# Patient Record
Sex: Male | Born: 1950 | Race: Black or African American | Hispanic: No | Marital: Married | Smoking: Former smoker
Health system: Southern US, Community
[De-identification: ages and names within clinical notes are randomized; demographics above are authoritative.]

## PROBLEM LIST (undated history)

## (undated) DIAGNOSIS — H409 Unspecified glaucoma: Secondary | ICD-10-CM

## (undated) DIAGNOSIS — I82409 Acute embolism and thrombosis of unspecified deep veins of unspecified lower extremity: Secondary | ICD-10-CM

## (undated) DIAGNOSIS — M1990 Unspecified osteoarthritis, unspecified site: Secondary | ICD-10-CM

## (undated) DIAGNOSIS — Z9889 Other specified postprocedural states: Secondary | ICD-10-CM

## (undated) DIAGNOSIS — E7800 Pure hypercholesterolemia, unspecified: Secondary | ICD-10-CM

## (undated) DIAGNOSIS — R112 Nausea with vomiting, unspecified: Secondary | ICD-10-CM

## (undated) DIAGNOSIS — I10 Essential (primary) hypertension: Secondary | ICD-10-CM

## (undated) DIAGNOSIS — E119 Type 2 diabetes mellitus without complications: Secondary | ICD-10-CM

---

## 2004-10-11 ENCOUNTER — Ambulatory Visit: Payer: Self-pay | Admitting: Unknown Physician Specialty

## 2007-07-26 ENCOUNTER — Emergency Department: Payer: Self-pay | Admitting: Emergency Medicine

## 2007-07-26 ENCOUNTER — Other Ambulatory Visit: Payer: Self-pay

## 2007-07-28 ENCOUNTER — Ambulatory Visit: Payer: Self-pay | Admitting: Internal Medicine

## 2008-06-11 IMAGING — CT CT HEAD WITHOUT CONTRAST
2 series · 16 of 30 positions shown, 20 images · non-contrast
Comparison: none

REASON FOR EXAM: vertigo
COMMENTS:

PROCEDURE:     CT  - CT HEAD WITHOUT CONTRAST  - July 26, 2007  [DATE]
RESULT:     Comparison: No available comparison exam.
Procedure: CT examination of the head was performed without intravenous
contrast. Collimation is 5 mm.

[Series 2: without · axial · non-contrast · 0.45mm/px · z∈[+232,+352]mm · 13 of 28 slices shown, 17 images]
[im 2/28  brain]
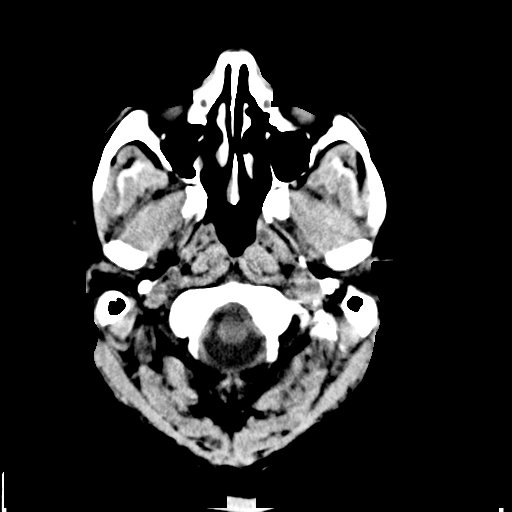
[im 2/28  bone]
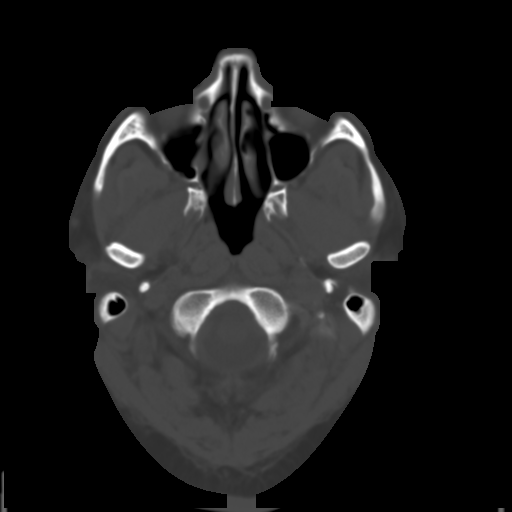
[im 4/28  brain]
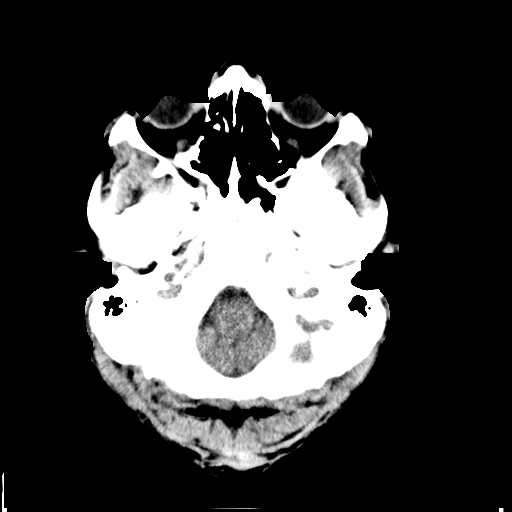
[im 6/28  brain]
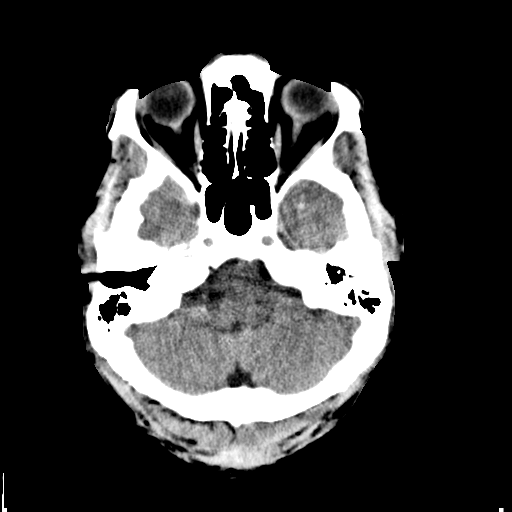
[im 8/28  brain]
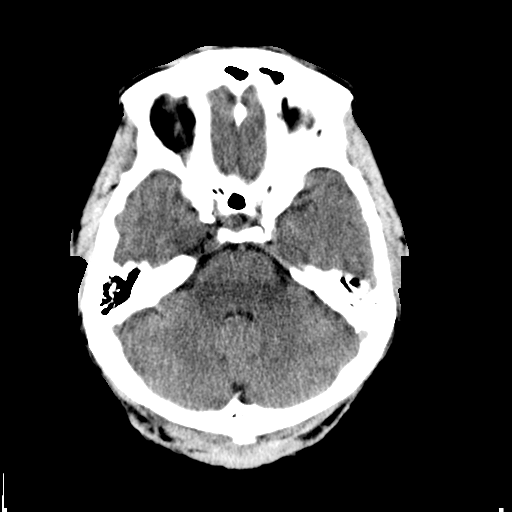
[im 10/28  brain]
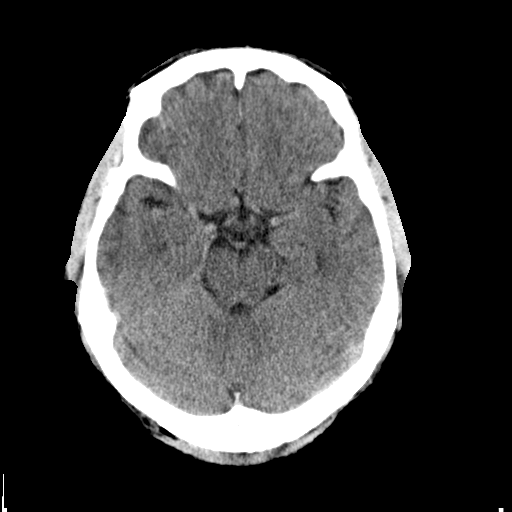
[im 10/28  bone]
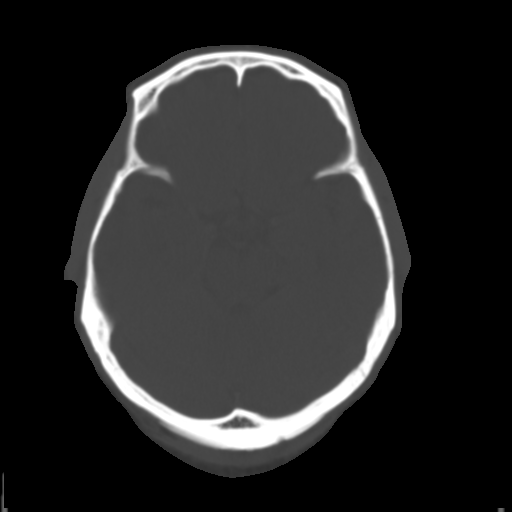
[im 12/28  brain]
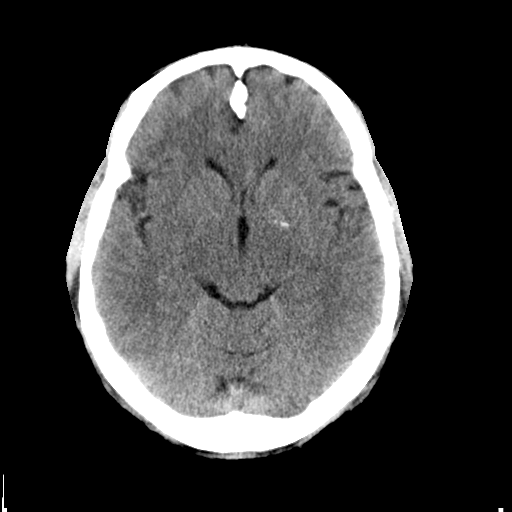
[im 14/28  brain]
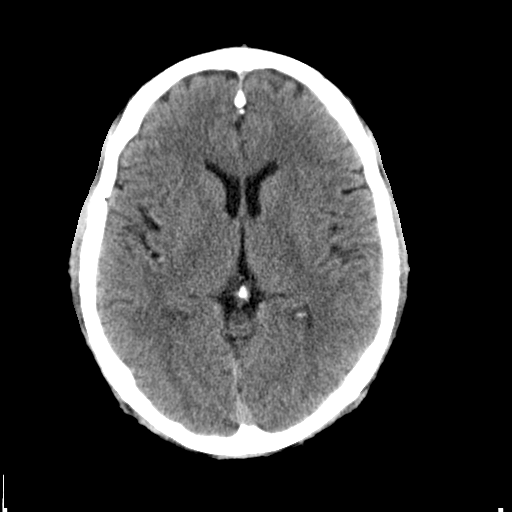
[im 16/28  brain]
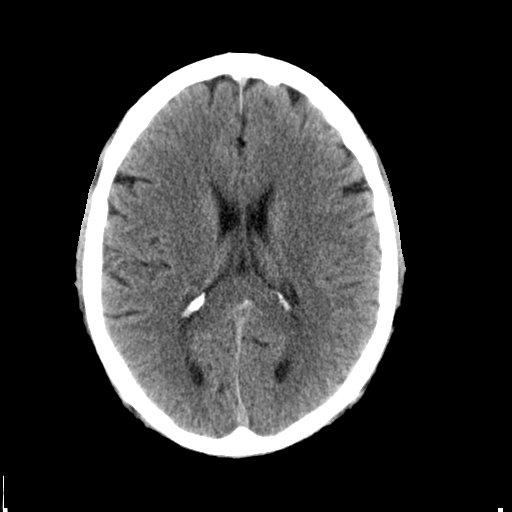
[im 18/28  brain]
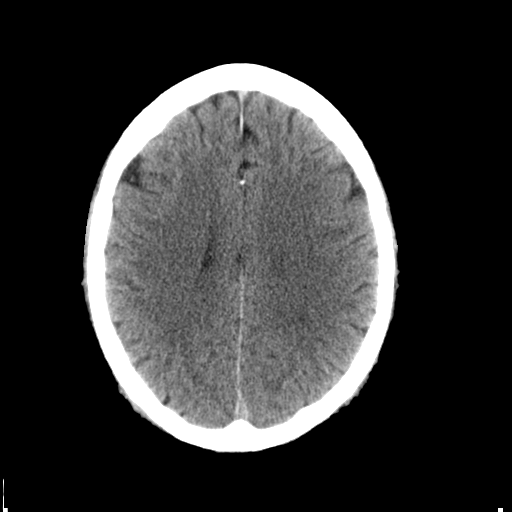
[im 18/28  bone]
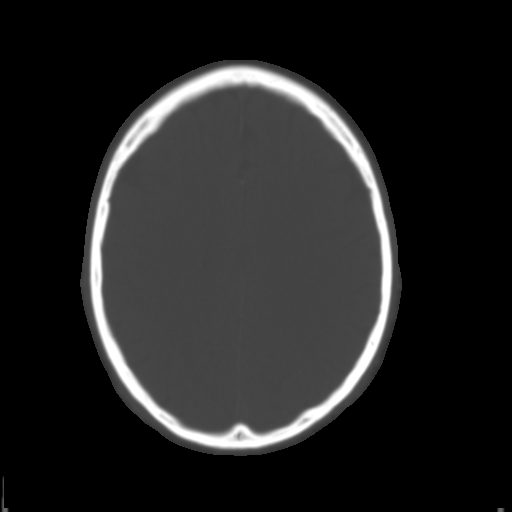
[im 20/28  brain]
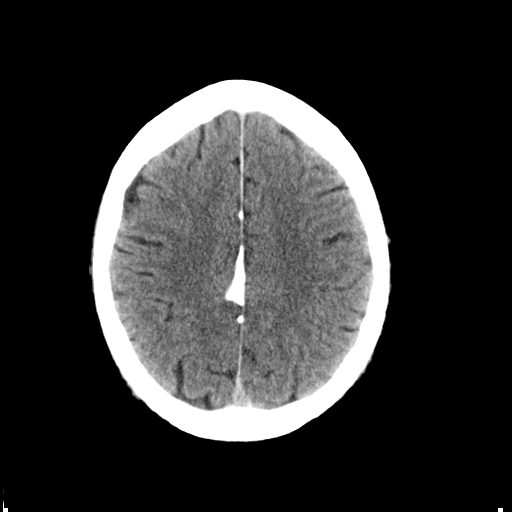
[im 22/28  brain]
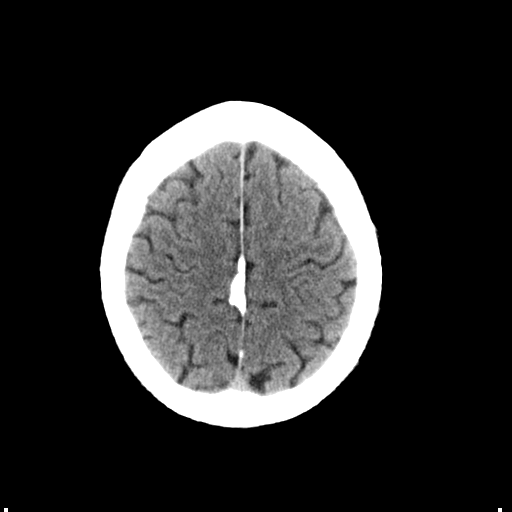
[im 24/28  brain]
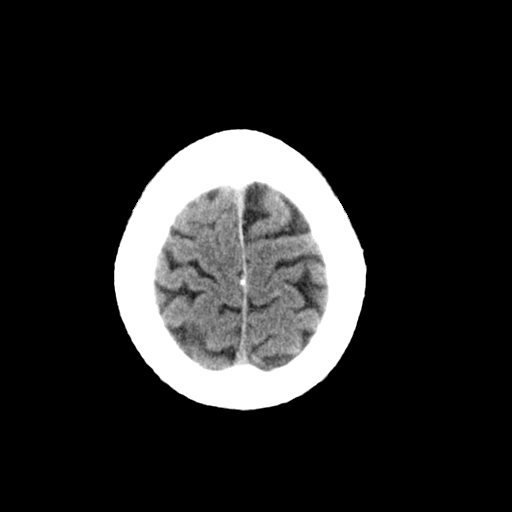
[im 26/28  brain]
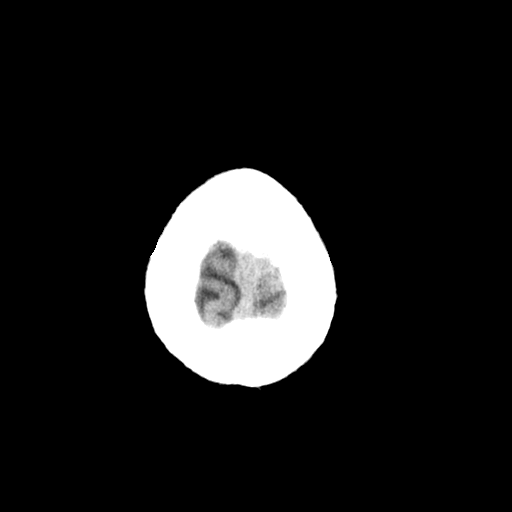
[im 26/28  bone]
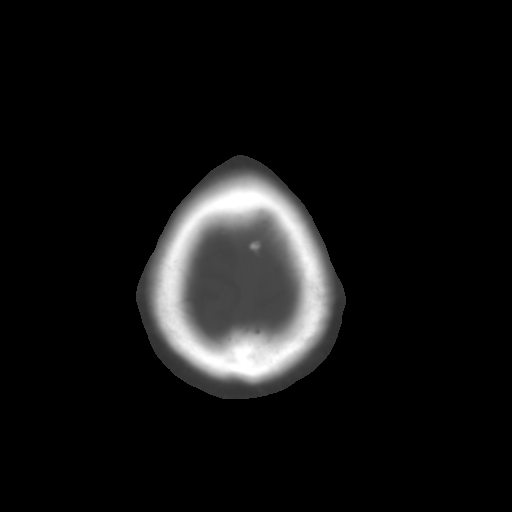

[Series 3: bone · axial · 0.45mm/px · z∈[+232,+272]mm · 3 of 28 slices shown]
[im 2/28  bone]
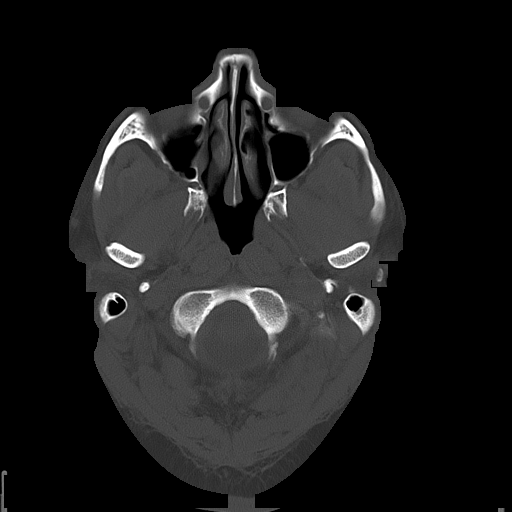
[im 6/28  bone]
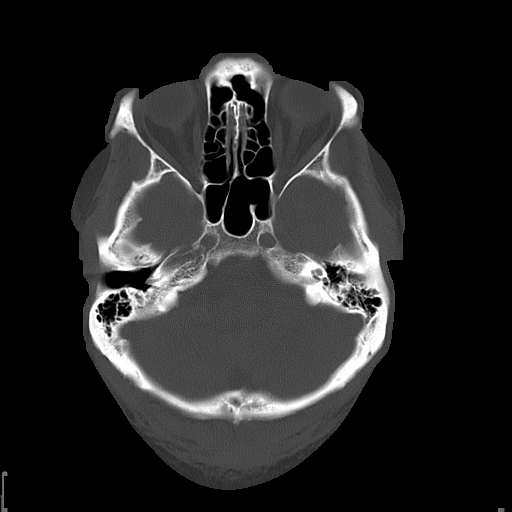
[im 10/28  bone]
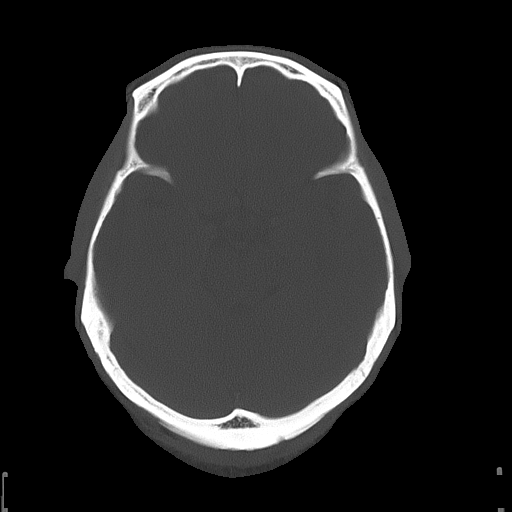

[16 of 30 positions shown; findings below may reference images not displayed]

FINDINGS: No evidence of intracranial hemorrhage, mass-effect, or ventricular
dilatation. The gray and white matters are differentiated. No displaced
calvarial fracture is noted. The visualized paranasal sinuses and mastoid
air cells are unremarkable.
IMPRESSION: 1. Unremarkable CT of the head. If there is high clinical concern for acute
ischemia, MRI is more sensitive and specific.

Preliminary report was faxed to the emergency room by the night radiologist
shortly after the study was performed.

## 2009-12-22 DIAGNOSIS — I82409 Acute embolism and thrombosis of unspecified deep veins of unspecified lower extremity: Secondary | ICD-10-CM

## 2009-12-22 HISTORY — DX: Acute embolism and thrombosis of unspecified deep veins of unspecified lower extremity: I82.409

## 2010-07-24 ENCOUNTER — Ambulatory Visit: Payer: Self-pay | Admitting: Family Medicine

## 2010-07-24 ENCOUNTER — Inpatient Hospital Stay: Payer: Self-pay | Admitting: Family Medicine

## 2010-07-25 LAB — PSA

## 2012-12-22 HISTORY — PX: JOINT REPLACEMENT: SHX530

## 2013-08-15 ENCOUNTER — Ambulatory Visit: Payer: Self-pay | Admitting: General Practice

## 2013-08-15 DIAGNOSIS — I10 Essential (primary) hypertension: Secondary | ICD-10-CM

## 2013-08-15 LAB — URINALYSIS, COMPLETE
Bacteria: NONE SEEN
Bilirubin,UR: NEGATIVE
Glucose,UR: >=500 mg/dL (ref 0–75)
Leukocyte Esterase: NEGATIVE
Nitrite: NEGATIVE
Ph: 6 (ref 4.5–8.0)
RBC,UR: 1 /HPF (ref 0–5)
Squamous Epithelial: NONE SEEN

## 2013-08-15 LAB — PROTIME-INR: Prothrombin Time: 12.5 secs (ref 11.5–14.7)

## 2013-08-15 LAB — APTT: Activated PTT: 27.1 secs (ref 23.6–35.9)

## 2013-08-16 LAB — MRSA PCR SCREENING

## 2013-09-07 ENCOUNTER — Inpatient Hospital Stay: Payer: Self-pay | Admitting: General Practice

## 2013-09-08 LAB — BASIC METABOLIC PANEL
BUN: 6 mg/dL — ABNORMAL LOW (ref 7–18)
Calcium, Total: 8.2 mg/dL — ABNORMAL LOW (ref 8.5–10.1)
Chloride: 100 mmol/L (ref 98–107)
Co2: 28 mmol/L (ref 21–32)
EGFR (African American): >60
Glucose: 137 mg/dL — ABNORMAL HIGH (ref 65–99)
Osmolality: 266 (ref 275–301)
Potassium: 4 mmol/L (ref 3.5–5.1)

## 2013-09-08 LAB — PLATELET COUNT: Platelet: 134 10*3/uL — ABNORMAL LOW (ref 150–440)

## 2013-09-08 LAB — HEMOGLOBIN: HGB: 13.6 g/dL (ref 13.0–18.0)

## 2013-09-09 LAB — BASIC METABOLIC PANEL
Calcium, Total: 8.5 mg/dL (ref 8.5–10.1)
Chloride: 94 mmol/L — ABNORMAL LOW (ref 98–107)
Co2: 29 mmol/L (ref 21–32)
Glucose: 150 mg/dL — ABNORMAL HIGH (ref 65–99)
Sodium: 130 mmol/L — ABNORMAL LOW (ref 136–145)

## 2013-09-09 LAB — HEMOGLOBIN: HGB: 13.6 g/dL (ref 13.0–18.0)

## 2013-09-09 LAB — PLATELET COUNT: Platelet: 132 10*3/uL — ABNORMAL LOW (ref 150–440)

## 2013-12-22 HISTORY — PX: COLONOSCOPY W/ POLYPECTOMY: SHX1380

## 2014-07-25 IMAGING — CR DG KNEE 1-2V*L*
1 series · 2 of 2 positions shown · non-contrast
Comparison: none

REASON FOR EXAM: postop
COMMENTS:   Bedside (portable):Y

[Series 1: ap · 0.17mm/px · 2 of 2 slices shown]
[im 1/2]
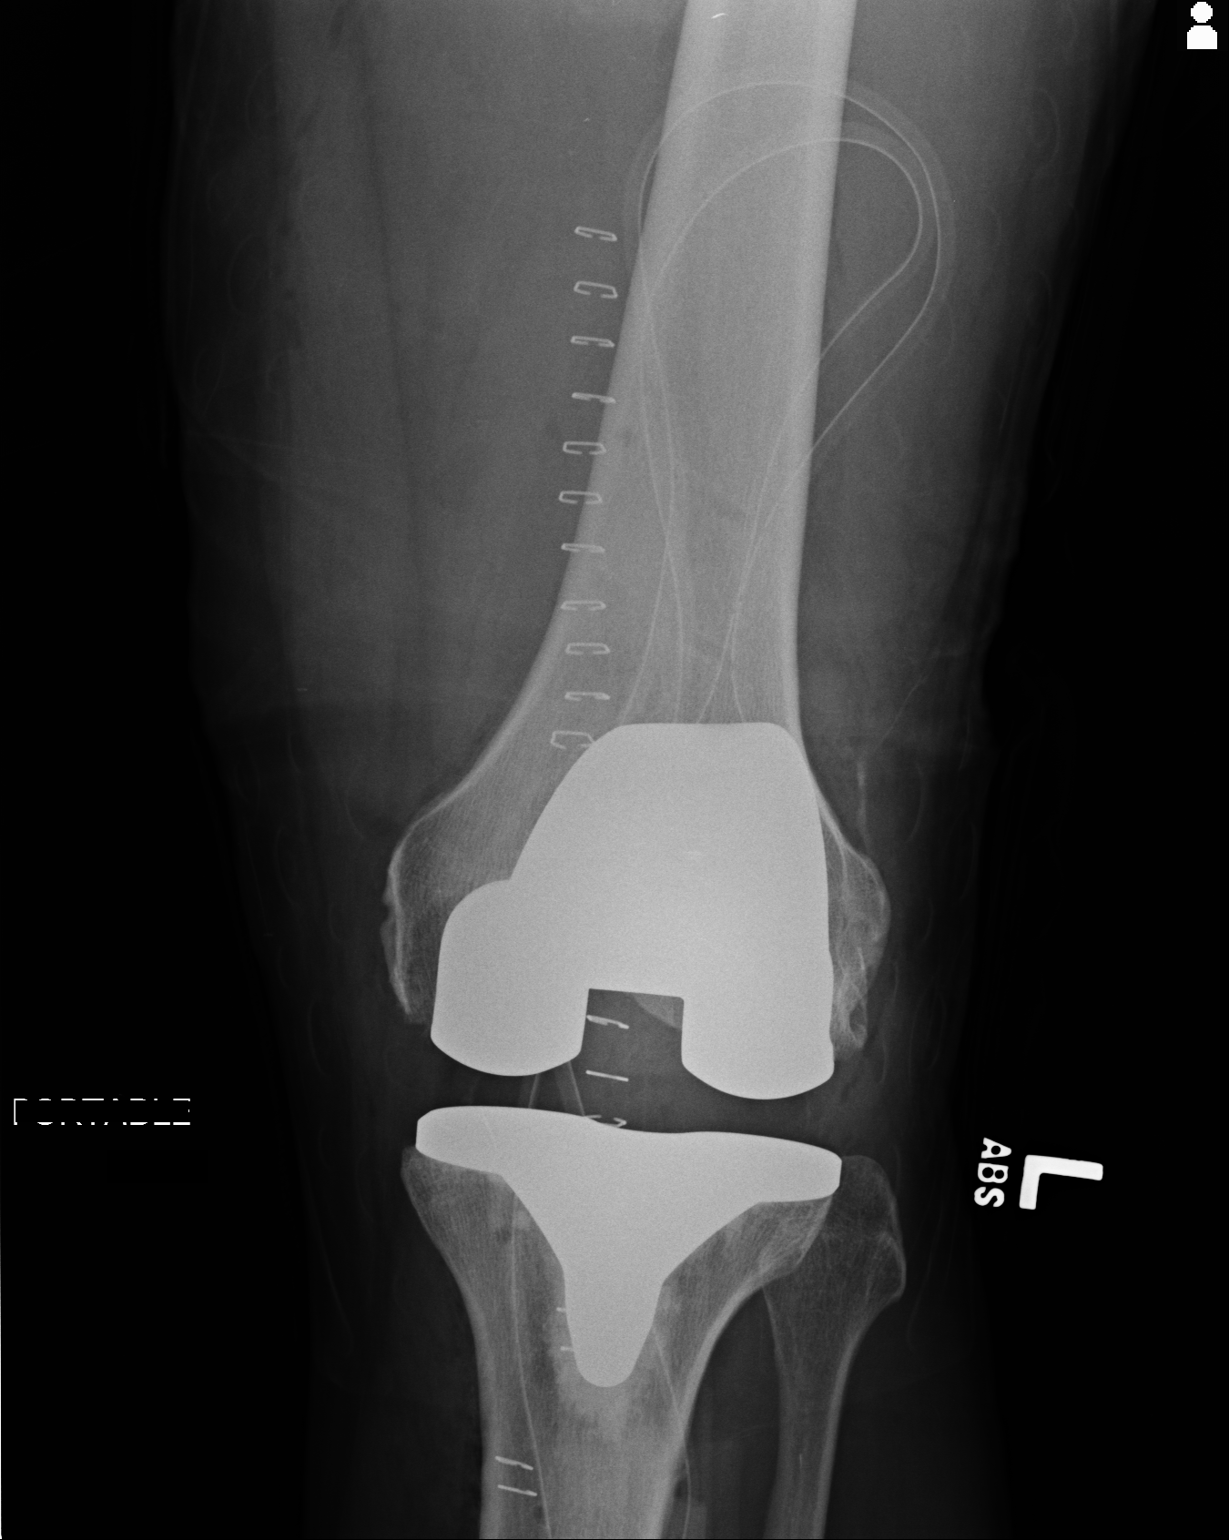
[im 2/2]
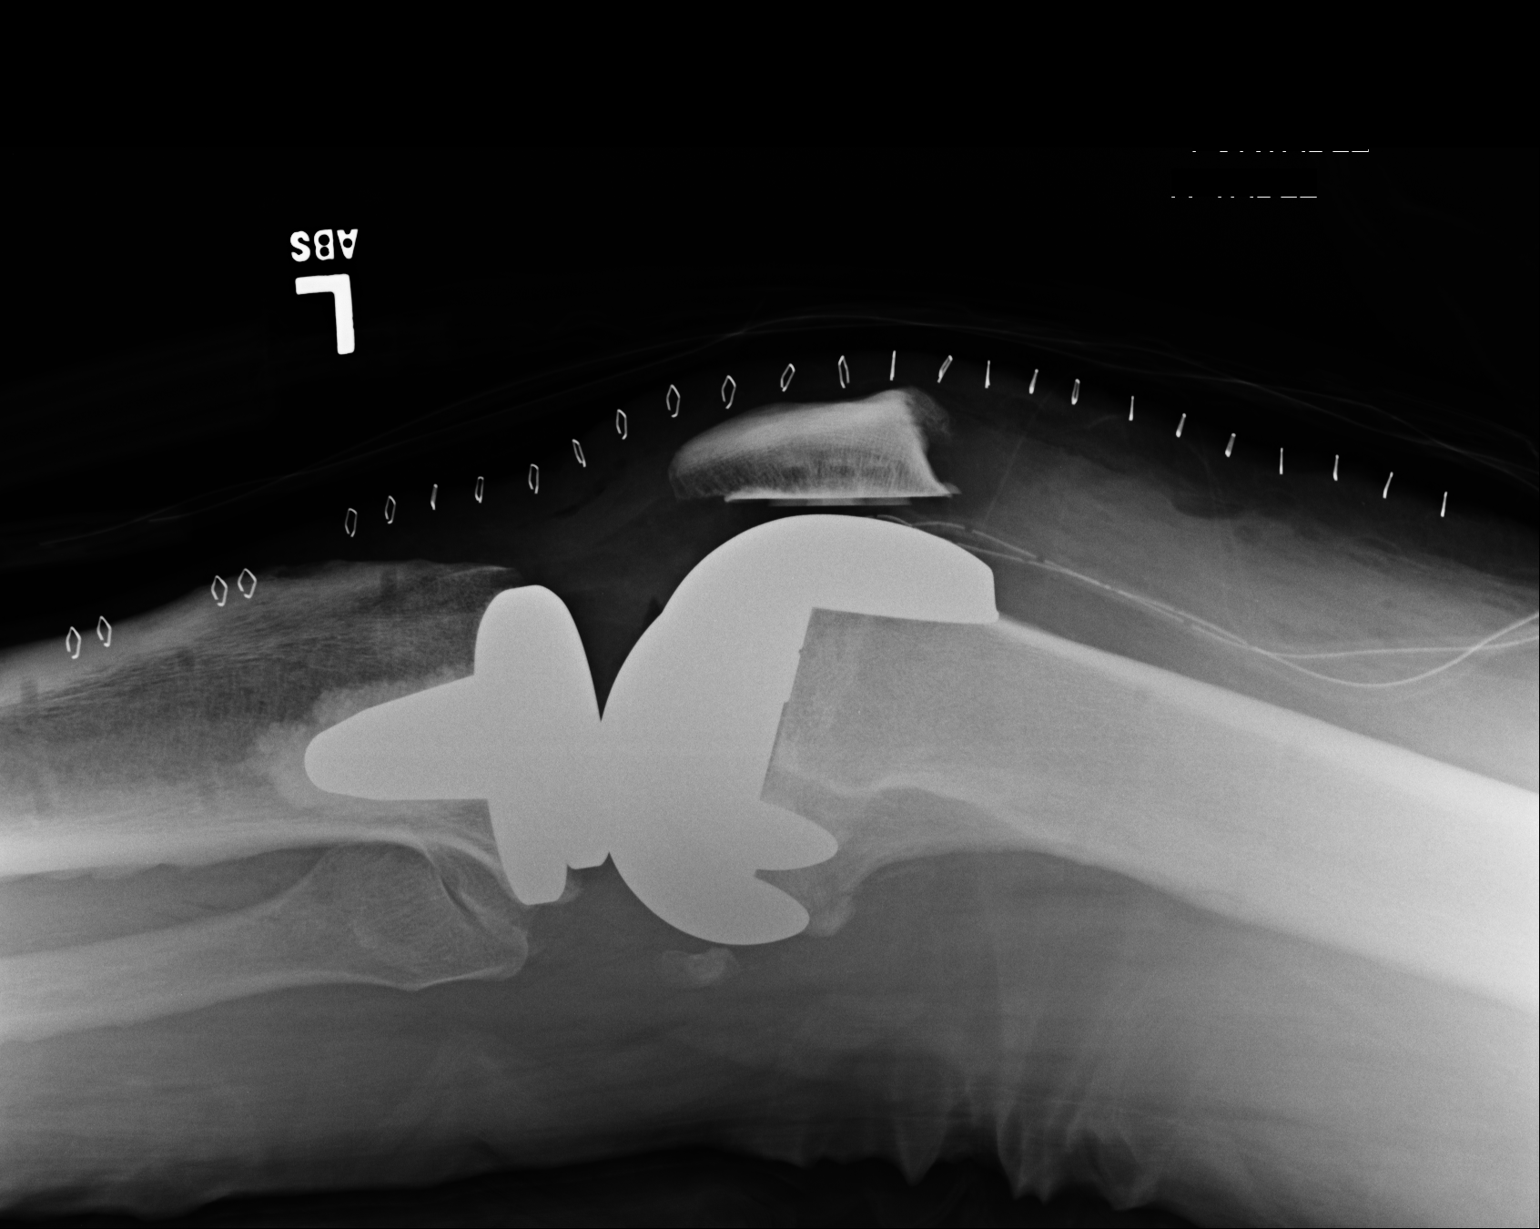

[2 of 2 positions shown; findings below may reference images not displayed]

PROCEDURE:     DXR - DXR KNEE LEFT AP AND LATERAL  - September 07, 2013 [DATE]

RESULT:     AP and lateral portable views of the left knee reveal the
patient to have undergone joint prosthesis placement. Radiographic
positioning of the prosthetic components is good. Surgical drain lines and
skin staples are present.
IMPRESSION: The patient has undergone left knee joint prosthesis
placement. Further interpretation is deferred to Dr. Don Lolito.

[REDACTED]

## 2014-12-04 ENCOUNTER — Ambulatory Visit: Payer: Self-pay | Admitting: Unknown Physician Specialty

## 2015-04-13 NOTE — Consult Note (Signed)
Brief Consult Note: Diagnosis: 1. Uncontrolled HTN 2. DM 3. BPH 4. Glaucoma 5. s/p Left total knee replacement POD # 2.   Patient was seen by consultant.   Consult note dictated.   Orders entered.   Comments: 64 yo male w/ hx of HTN, DM, Glaucoma, BPH, OA came into hospital due to left total knee repalcement. Pt. is POD # 2 and noted to have uncontrolled HTN.   1. Uncontrolled HTN - cont. Losartan/HCTZ and will add low dose Norvasc.  - will also add PRN hydralazine and monitor b.p.   2. DM - cont. Victoza, Metformin, glyburide.   3. Hyperlipidemia - cont. Atorvastatin  4. Glaucoma - cont. Combigan eye drops.   5. BPH - cont. Finasteride, Flomax.   6. s/p left total knee replacement - cont. care as per Ortho - cont. pain control, DVT prophylaxis.   Thanks for the consult and will follow with you.  Job # [id number].  Electronic Signatures: Henreitta Leber (MD)  (Signed 19-Sep-14 14:27)  Authored: Brief Consult Note   Last Updated: 19-Sep-14 14:27 by Henreitta Leber (MD)

## 2015-04-13 NOTE — Discharge Summary (Signed)
PATIENT NAME:  Bruce, [name] MR#:  [id number] DATE OF BIRTH:  [birth year]  DATE OF ADMISSION:  09/07/2013 DATE OF DISCHARGE:  09/10/2013  ADMITTING DIAGNOSIS: Degenerative arthrosis of the left knee.   DISCHARGE DIAGNOSIS: Degenerative arthrosis of the left knee.   HISTORY: The patient is a pleasant 64 year old gentleman who has been followed at [facility] for progression of left knee pain. He reported a several year history of progressive knee pain. The patient had seen some improvement in his symptoms in the past following a Synvisc injection; however, he states that the pain now has increased in severity. He had localized most of the pain along the medial aspect of the knee. His pain was noted be aggravated with weight-bearing activities. He was also having some pain during the nighttime hours. At the time of surgery, he was not requiring any ambulatory aid. The pain however did increase to the point that it was significantly interfering with his activities of daily living. X-rays taken in [place] showed narrowing of the medial cartilage space with associated varus alignment. He was noted to have osteophyte as well as subchondral sclerosis. After discussion of the risks and benefits of surgical intervention, the patient expressed his understanding of the risks and benefits and agreed with plans for surgical intervention.   PROCEDURE: Left total knee arthroplasty using computer-assisted navigation.   ANESTHESIA: Spinal.   SOFT TISSUE RELEASE: Anterior cruciate ligament, posterior cruciate ligament, deep and superficial medial collateral ligament, as well as the patellofemoral ligament.   IMPLANTS UTILIZED: DePuy PFC Sigma size 4 posterior stabilized femoral component (cemented), size 4 MBT tibial component (cemented), 41 mm three-pegged oval dome patella (cemented) and a 12.5 mm stabilized rotating platform polyethylene insert.   HOSPITAL COURSE: The patient tolerated  the procedure very well. He had no complications. He was then taken to the PAC-U where he was stabilized and then transferred to the orthopedic floor. The patient began receiving anticoagulation therapy of Lovenox 30 mg subcu q. 12 hours per anesthesia and pharmacy protocol. He was fitted with TED stockings bilaterally. These were allowed to be removed 1 hour per 8 hour shift. The left one was applied on day 2 following removal of the Hemovac and dressing change. The patient was also fitted with the AV-I compression foot pumps bilaterally set at 80 mmHg. His calves have been nontender. There has been no evidence of any DVTs. Negative Homans sign. Heels were elevated off the bed using rolled towels.   The patient has denied any chest pain or shortness of breath. Vital signs have been stable. He has been afebrile. Hemodynamically he was stable and no transfusions were given other than the Autovac transfusion given the first 6 hours postoperatively.   Physical therapy was initiated on day 1 for gait training and transfers. He has done very well. Upon being discharged to home, he was ambulating greater than 200 feet. He was able go up 4 steps. He was independent with bed to chair transfers. Occupational therapy was also initiated on day 1 for ADL and assistive devices.   The patient's IV, Foley and Hemovac were DC'd on day 2 along with a dressing change. The wound was free of any drainage or signs of infection. Polar Care was reapplied to the surgical leg maintaining a temperature of 40 to 50 degrees Fahrenheit.   DISPOSITION: The patient is being discharged to home in improved stable condition.   DISCHARGE INSTRUCTIONS:  He may weight bear as tolerated. Continue using a  walker until cleared by physical therapy to go to a quad cane. He will receive home health PT. Continue with TED stockings bilaterally. These are to be worn during the day but may be removed at night. Continue Polar Care 24 hours a day for the  first 2 weeks. Elevate the heels off the bed. Incentive spirometer q. 1 hour while awake. Encourage cough and deep breathing q. 2 hours. He is placed on a diabetic diet. Change dressing as needed. He has a follow-up appointment on 10/02 at 37:15 with [facility]. He is to call the clinic sooner if any temperatures of 101.5 or greater or excessive bleeding.   DRUG ALLERGIES: LISINOPRIL, ZOCOR.   DISCHARGE MEDICATIONS: The patient will be going home on his regular medication that he was on prior to admission. He was given a prescription for oxycodone 5 to 10 mg q. 4 to 6 hours p.r.n. for pain, Ultram 50 to 100 mg q. 4 to 6 hours p.r.n. for pain and Lovenox 40 mg subcu q. day for 14 days and then discontinue and begin taking one 81 mg enteric-coated aspirin.   PAST MEDICAL HISTORY: Positive for: 1.  Hypertension.  2.  Hyperlipidemia. 3.  Diabetes type 2.  4.  Glaucoma. 5.  Arthritis of the knees. 6.  DVT of the right lower extremity. ____________________________ Vance Peper, PA [id number] D: 09/09/2013 07:41:32 ET T: 09/09/2013 08:01:24 ET JOB#: [id number]  cc: Vance Peper, PA, <Dictator> JON WOLFE PA ELECTRONICALLY SIGNED 09/13/2013 7:38

## 2015-04-13 NOTE — Consult Note (Signed)
PATIENT NAME:  Bruce [name], Bruce [name] MR#:  [id number] DATE OF BIRTH:  [birth year]  DATE OF CONSULTATION:  09/09/2013  REFERRING PHYSICIAN:  Skip Estimable, MD CONSULTING PHYSICIAN:  Belia Heman. Verdell Carmine, MD  PRIMARY CARE PHYSICIAN: [name], MD  REASON FOR CONSULTATION: Uncontrolled hypertension.   HISTORY OF PRESENT ILLNESS: This is a 64 year old male who came to the hospital 3 days ago due to a left total knee replacement. Postoperative day number 2 the patient was noted to have uncontrolled hypertension and hospitalist services were contacted for further treatment and evaluation. The patient presently denies any chest pain, shortness of breath, nausea, vomiting, abdominal pain, fevers, chills, cough or any other associated symptoms presently.   REVIEW OF SYSTEMS: CONSTITUTIONAL: No documented fever. No weight gain or weight loss.  EYES: No blurred or double vision.  ENT: No tinnitus. No postnasal drip. No redness of the oropharynx.  RESPIRATORY: No cough. No wheeze. No hemoptysis. No dyspnea.  CARDIOVASCULAR: No chest pain. No orthopnea. No palpitations. No syncope.  GASTROINTESTINAL: Positive nausea. No vomiting. No diarrhea. No abdominal pain. No melena or hematochezia.  GENITOURINARY: No dysuria or hematuria.  ENDOCRINE: No polyuria or nocturia. No heat or cold intolerance. HEMATOLOGIC: No anemia. No bruising. No bleeding.  INTEGUMENTARY: No rashes. No lesions.  MUSCULOSKELETAL: No arthritis. No swelling. No gout. Positive osteoarthritis of the left knee.  NEUROLOGIC: No numbness. No tingling. No ataxia. No seizure type activity.  PSYCHIATRIC: No anxiety. No insomnia. No ADD.   PAST MEDICAL HISTORY: Consistent with hypertension, diabetes, hyperlipidemia, BPH, glaucoma, osteoarthritis.   ALLERGIES: LISINOPRIL AND ZOCOR.   SOCIAL HISTORY: No smoking. No alcohol abuse. No illicit drug abuse. Lives at home with his wife.   FAMILY HISTORY: Mother died from a stroke. Father died from  complications of old age.   CURRENT MEDICATIONS:  1.  Atorvastatin 10 mg daily. 2.  Combigan 0.2/0.5% ophthalmic solution b.i.d.  3.  Finasteride 5 mg daily. 4.  Glyburide/metformin 5/500 mg 2 tabs b.i.d. 5.  Hydrochlorothiazide/losartan 25/100 mg 1 tab daily. 6.  Januvia 50 mg daily. 7.  Latanoprost 0.005% ophthalmic solution at bedtime. 8.  Oxycodone 5 mg 1 to 2 tabs q. 4 hours as needed. 9.  Flomax 0.4 mg daily.  10.  Tramadol 50 mg 1 to 2 tabs q. 4 hours as needed. 11.  Tylenol 650 mg q. 4 hours as needed. 12.  Victoza 1.8 mg subcutaneously daily.   PHYSICAL EXAMINATION: Presently is as follows:  VITAL SIGNS: Temperature is 98.5, pulse 73, respirations 18, blood pressure 158/90 and sats 97% on room air.  GENERAL: He is a pleasant-appearing male, no apparent distress.  HEAD, EYES, EARS, NOSE, THROAT: The patient is atraumatic, normocephalic. Extraocular muscles are intact. Pupils equal and reactive to light. Sclerae anicteric. No conjunctival injection. No pharyngeal erythema.  NECK: Supple. No jugular venous distention. No bruits. No lymphadenopathy. No thyromegaly.  HEART: Regular rate and rhythm. No murmurs. No rubs. No clicks.  LUNGS: Clear to auscultation bilaterally. No rales or rhonchi. No wheezes.  ABDOMEN: Soft, flat, nontender and nondistended. Has good bowel sounds. No hepatosplenomegaly appreciated.  EXTREMITIES: No evidence of any cyanosis, clubbing or peripheral edema. Has +2 pedal and radial pulses bilaterally. The patient has a dressing on his left knee from his left knee replacement.  NEUROLOGIC: The patient is alert, awake and oriented x 3 with no focal motor or sensory deficits appreciated bilaterally.  SKIN: Moist and warm with no rashes appreciated.  LYMPHATIC: There is no cervical or axillary  lymphadenopathy.   LABORATORY DATA: Serum glucose 150, BUN 5, creatinine 0.7, sodium 130, potassium 3.5, chloride 94 and bicarb 29. The patient's  hemoglobin is 13.6 and  platelet count 132.   ASSESSMENT AND PLAN: This is a 64 year old male with a history of hypertension, diabetes, glaucoma, benign prostatic hypertrophy, osteoarthritis and hyperlipidemia who presents to the hospital due to a left total knee replacement, postoperative day number 2. The patient is noted to have uncontrolled hypertension.  1.  Uncontrolled hypertension. The patient presently is not in any pain that would be contributing to his high blood pressure. The patient does say that his blood pressure at home has also been were running high; therefore, it is likely related to his uncontrolled essential hypertension. For now, I will continue the patient's losartan/HCTZ. I will go ahead and add some low-dose Norvasc. The patient would benefit from being on an ACE inhibitor, but he is intolerant to it given his allergy. Also will add some p.r.n. hydralazine and follow his hemodynamics.  2.  Diabetes. Continue with his Victoza, metformin, glyburide and Januvia.  3.  Hyperlipidemia. Continue atorvastatin.  4.  Glaucoma. Continue Combigan and latanoprost eye drops. 5.  Benign prostatic hypertrophy. Continue finasteride and Flomax. 6.  Status post total left knee replacement, postop day number 2. Continue care as per orthopedics. Continue pain control is as per orthopedics. Continue deep vein thrombosis prophylaxis with Lovenox.  Thank you so much for the consultation. We will follow along with you.   TIME SPENT: 45 minutes.  ____________________________ Belia Heman. Verdell Carmine, MD vjs:sb D: 09/09/2013 14:27:47 ET T: 09/09/2013 14:51:15 ET JOB#: [id number]  cc: Belia Heman. Verdell Carmine, MD, <Dictator> Henreitta Leber MD ELECTRONICALLY SIGNED 09/16/2013 16:17

## 2015-04-13 NOTE — Op Note (Signed)
PATIENT NAME:  Bruce [name], Bruce [name] MR#:  [id number] DATE OF BIRTH:  02-25-1951  DATE OF PROCEDURE:  09/07/2013  PREOPERATIVE DIAGNOSIS:  Degenerative arthrosis of the left knee.   POSTOPERATIVE DIAGNOSIS:  Degenerative arthrosis of the left knee.   PROCEDURE PERFORMED:  Left total knee arthroplasty using computer-assisted navigation.   SURGEON:  Laurice Record. Hooten, M.D.   ASSISTANT:  Vance Peper, PA (required to maintain retraction throughout the procedure).   ANESTHESIA:  Spinal.   ESTIMATED BLOOD LOSS:  50 mL.   FLUIDS REPLACED:  2000 mL of crystalloid.   TOURNIQUET TIME:  102 minutes.   DRAINS:  Two medium drains to reinfusion system.   SOFT TISSUE RELEASES:  Anterior cruciate ligament, posterior cruciate ligament, deep and superficial medial collateral ligament and patellofemoral ligament.   IMPLANTS UTILIZED:  DePuy PFC Sigma size 4 posterior stabilized femoral component (cemented), size 4 MBT tibial component (cemented), 41 mm three peg oval dome patella (cemented), and a 12.5 mm stabilized rotating platform polyethylene insert.   INDICATIONS FOR SURGERY:  The patient is a 64 year old gentleman who has been seen for complaints of progressive left knee pain.  X-rays demonstrated severe degenerative changes in tricompartmental fashion with relative varus deformity.  After discussion of the risks and benefits of surgical intervention, the patient expressed understanding of the risks, benefits and agreed with plans for surgical intervention.   PROCEDURE IN DETAIL:  The patient was brought into the Operating Room and, after adequate spinal anesthesia was achieved, a tourniquet was placed on the patient's upper left thigh.  The patient's left knee and leg were cleaned and prepped with alcohol and DuraPrep, draped in the usual sterile fashion.  A "timeout" was performed as per usual protocol.  Time for administration of Ancef was 7:39 a.m.  The patient's left lower extremity was exsanguinated  using an Esmarch, a tourniquet was inflated to 300 mmHg.  An anterior longitudinal incision was made followed by a standard mid vastus approach.  A moderate effusion was evacuated.  The deep fibers of the medial collateral ligament were elevated in a subperiosteal fashion off the medial flare of the tibia so as to maintain a continuous soft tissue sleeve.  The patella was subluxed laterally and the patellofemoral ligament was incised.  Inspection of the knee demonstrated severe degenerative changes in tricompartmental fashion with full-thickness loss of articular cartilage to the medial compartment.  Prominent osteophytes were debrided using a rongeur.  The anterior and posterior cruciate ligaments were excised.  Two 4.0 mm Schanz pins were inserted into the femur and into the tibia for attachment of the array of trackers used for computer-assisted navigation.  Hip center was identified using circumduction technique.  Distal landmarks were mapped using the computer.  The distal femur and proximal tibia were mapped using the computer.  Distal femoral cutting guide was positioned using computer-assisted navigation so as to achieve a 5 degree distal valgus cut.  Cut was performed and verified using the computer.  Distal femur was sized and it was felt that a size 4 femoral component was appropriate.  A size 4 cutting guide was positioned and the anterior cut was performed and verified using the computer.  This was followed by completion of the posterior and chamfer cuts.  Femoral cutting guide for a central box was then positioned and the central box cut was performed.   Attention was then directed to the proximal tibia.  Medial and lateral menisci were excised.  The extramedullary tibial cutting guide was positioned  using computer-assisted navigation so as to achieve a 0 degree varus valgus alignment and 0 degree posterior slope.  Cut was performed and verified using the computer.  The proximal tibia was sized and  it was felt that a size 4 tibial tray was appropriate.  Tibial and femoral trials were inserted followed by insertion of a 10 mm polyethylene insert.  The knee was felt to be tight medially.  A Cobb elevator was used to elevate the superficial fibers of the medial collateral ligament.  A 10 mm polyethylene trial was replaced with a 12.5 mm polyethylene trial.  Excellent mediolateral soft tissue balancing was appreciated both in full extension and in flexion.  Finally, the patella was cut and prepared so as to accommodate a 41 mm three peg oval dome patella.  Patellar trial was placed and the knee was placed through a range of motion with excellent patellar tracking appreciated.   Femoral trial was removed after debridement of posterior osteophytes.  Central post hole for the tibial component was reamed followed by insertion of a keel punch.  Tibial trial was then removed.  The cut surfaces of bone were irrigated with copious amounts of normal saline with antibiotic solution using pulsatile lavage and then suctioned dry.  Polymethyl methacrylate cement with gentamicin was prepared in the usual fashion using a vacuum mixer.  Cement was applied to the cut surface of the proximal tibia as well as along the undersurface of a size 4 MBT tibial component.  The tibial component was positioned and impacted into place.  Excess cement was removed using freer elevators.  Cement was then applied to the cut surface of the femur as well as along the posterior flanges of a size 4 posterior stabilized femoral component.  Femoral component was positioned and impacted into place.  Excess cement was removed using freer elevators.  A 12.5 mm polyethylene trial was inserted and the knee was brought in full extension with steady axial compression applied.  Finally, cement was applied to the backside of a 41 mm three peg oval dome patella and the patellar component was positioned and patellar clamp applied.  Excess cement was removed  using freer elevators.    After adequate curing of cement, the tourniquet was deflated after a total tourniquet time of 102 minutes.  Hemostasis was achieved using electrocautery.  The knee was irrigated with copious amounts of normal saline with antibiotic solution using pulsatile lavage and then suctioned dry.  The knee was inspected for any residual cement debris.  20 mL of 1.3% Exparel in 40 mL of normal saline was injected along the posterior capsule, medial and lateral gutters and along the arthrotomy site.  A 12.5 mm stabilized rotating platform polyethylene insert was inserted and the knee was placed through a range of motion with excellent patellar tracking appreciated and excellent mediolateral soft tissue balancing noted.  Two medium drains were placed in the wound bed and brought out through a separate stab incision to be attached to a reinfusion system.  The medial parapatellar portion of the incision was reapproximated using interrupted sutures of #1 Vicryl.  The subcutaneous tissue was approximated in layers using first #0 Vicryl followed by #2-0 Vicryl.  Skin was closed with skin staples.  A sterile dressing was applied.  It should be noted that 30 mL of 0.25% Marcaine with epinephrine was injected along the subcutaneous tissue prior to closure with skin staples.   The patient tolerated the procedure well.  He was transported to the  recovery room in stable condition.     ____________________________ Laurice Record. Holley Bouche., MD jph:ea D: 09/07/2013 18:21:54 ET T: 09/07/2013 23:03:22 ET JOB#: [id number]  cc: Laurice Record. Holley Bouche., MD, <Dictator> JAMES P Holley Bouche MD ELECTRONICALLY SIGNED 09/24/2013 9:20

## 2015-04-13 NOTE — Consult Note (Signed)
Admit Diagnosis:   LEFT TKR 49179.: Onset Date: 09-Sep-2013, Status: Active, Description: LEFT TKR [phone].      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 Medical consult was requested for inadequately controlled hypertension. Patient is currently receiving Losartan 100/25 mg daily, Norvasc 5 mg daily and Hydralazine prn for elevated blood pressure readings. Blood pressure this morning was 159/84 prior to receiving the anti-hypertensive medications as listed above.   Present Illness Patient is sitting up comfortably in a chair with his wife beside him. No c/o headache, dizziness, chest pain or shortness of breath. No c/o pain.   Home Medications: Medication Instructions Status  tramadol 50 mg oral tablet 1-2 tab(s) orally every 4 hours, As needed, pain for mild to moderate pain Active  oxycodone 5 mg oral tablet 1-2 tab(s) orally every 4 hours, As needed for severe pain Active  enoxaparin 40 mg subcutaneous once a day x 14 days.  Begin taking 81 mg EC Aspirin once a day after finishing lovenox. Active  Januvia 100 mg oral tablet 1/2 orally once a day  Active  glyBURIDE-metformin 5 mg-500 mg oral tablet 2  orally 2 times a day  Active  tamsulosin 0.4 mg oral capsule 1 cap(s) orally 2 times a day Active  atorvastatin 10 mg oral tablet 1 tab(s) orally once a day (at bedtime) Active  finasteride 5 mg oral tablet 1 tab(s) orally once a day (in the morning) Active  Combigan 0.2%-0.5% ophthalmic solution 1 drop(s) to each affected eye every 12 hours Active  latanoprost ophthalmic 0.005% ophthalmic solution 1 drop(s) to each affected eye once a day (at bedtime) Active  Victoza 18 mg/3 mL subcutaneous solution 1.8 milligram(s) subcutaneous once a  day (in the evening) Active  Tylenol 325 mg oral tablet 2 tab(s) orally every 4 hours, As Needed - for Pain Active  hydrochlorothiazide-losartan 25 mg-100 mg oral tablet 1 tab(s) orally once a day (in the morning) Active    Lisinopril: Cough  Zocor: Unknown  Case History and Physical Exam:  HEENT PERLA   Neck/Nodes Supple  No Adenopathy   Chest/Lungs Clear  no rhonchi, no crackles   Cardiovascular No Murmurs or Gallops  Normal Sinus Rhythm   Abdomen Benign  soft, nontender, normal bowel sounds   Genitalia Not examined   Rectal Not examined   Neurological Grossly WNL   Skin WNL   Nursing/Ancillary Notes: **Vital Signs.:   20-Sep-14 05:31  Vital Signs Type Routine  Temperature Temperature (F) 98.9  Celsius 37.1  Temperature Source oral  Pulse Pulse 97  Respirations Respirations 18  Systolic BP Systolic BP 979  Diastolic BP (mmHg) Diastolic BP (mmHg) 84  Mean BP 109  Pulse Ox % Pulse Ox % 94  Pulse Ox Activity Level  At rest  Oxygen Delivery Room Air/ 21 %   Routine Chem:  18-Sep-14 04:11   Glucose, Serum  137  BUN  6  Creatinine (comp) 0.77  Sodium, Serum  133  Potassium, Serum 4.0  Chloride, Serum 100  CO2, Serum 28  Calcium (Total), Serum  8.2  Anion Gap  5  Osmolality (calc) 266  eGFR (African American) >60  eGFR (Non-African American) >60 (eGFR values <20m/min/1.73 m2 may be an indication of chronic kidney disease (CKD). Calculated eGFR is useful in  patients with stable renal function. The eGFR calculation will not be reliable in acutely ill patients when serum creatinine is changing rapidly. It is not useful in  patients on dialysis. The eGFR calculation may not be applicable to patients at the low and high extremes of body sizes, pregnant women, and vegetarians.)  Routine Hem:  18-Sep-14 04:11   Hemoglobin (CBC) 13.6 (Result(s) reported on 08 Sep 2013 at [facility]  Platelet Count (CBC)  134 (Result(s) reported on 08 Sep 2013 at [facility].)    XRay:    17-Sep-14 11:33, Knee Left AP and Lateral  Knee Left AP and Lateral   REASON FOR EXAM:    postop  COMMENTS:   Bedside (portable):Y    PROCEDURE: DXR - DXR KNEE LEFT AP AND LATERAL  - Sep 07 2013 11:33AM     RESULT: AP and lateral portable views of the left knee reveal the patient   to have undergone joint prosthesis placement. Radiographic positioning of   the prosthetic components is good. Surgical drain lines and skin staples   are present.    IMPRESSION:  The patient has undergone left knee joint prosthesis   placement. Further interpretation is deferred to Dr. Marry Guan.     Dictation Site: 2    Verified By: DAVID A. Martinique, M.D., MD    Impression [facility] s/p Left knee replacement POD#3, follow up visit for hypertension. No cardiac complaints. No c/o pain.   Plan Patient is tolerating ARB/Diuretic, Losartan HCTZ 100/25 mg daily without any c/o cough. Shall continue Norvasc/Amlodipine 5 mg daily today. Monitor BP readings. Shall increase Amlodipine to 10 mg daily if systolic BP remains elevated over 140 mm Hg. Thanks for the consult. Shall follow with you.   Electronic Signatures: Glendon Axe (MD)  (Signed 20-Sep-14 13:07)  Authored: Health Issues, General Aspect/Present Illness, Home Medications, Allergies, History and Physical Exam, Vital Signs, Labs, Radiology, Impression/Plan   Last Updated: 20-Sep-14 13:07 by Glendon Axe (MD)

## 2015-04-16 LAB — SURGICAL PATHOLOGY

## 2015-08-23 ENCOUNTER — Encounter
Admission: RE | Admit: 2015-08-23 | Discharge: 2015-08-23 | Disposition: A | Discharge status: Home / Self Care | Payer: BLUE CROSS/BLUE SHIELD | Source: Ambulatory Visit | Attending: Orthopedic Surgery | Admitting: Orthopedic Surgery

## 2015-08-23 DIAGNOSIS — Z01812 Encounter for preprocedural laboratory examination: Secondary | ICD-10-CM | POA: Insufficient documentation

## 2015-08-23 HISTORY — DX: Type 2 diabetes mellitus without complications: E11.9

## 2015-08-23 HISTORY — DX: Nausea with vomiting, unspecified: Z98.890

## 2015-08-23 HISTORY — DX: Essential (primary) hypertension: I10

## 2015-08-23 HISTORY — DX: Acute embolism and thrombosis of unspecified deep veins of unspecified lower extremity: I82.409

## 2015-08-23 HISTORY — DX: Unspecified osteoarthritis, unspecified site: M19.90

## 2015-08-23 HISTORY — DX: Other specified postprocedural states: R11.2

## 2015-08-23 LAB — URINALYSIS COMPLETE WITH MICROSCOPIC (ARMC ONLY)
BILIRUBIN URINE: NEGATIVE
Bacteria, UA: NONE SEEN
GLUCOSE, UA: 50 mg/dL — AB
HGB URINE DIPSTICK: NEGATIVE
KETONES UR: NEGATIVE mg/dL
LEUKOCYTES UA: NEGATIVE
NITRITE: NEGATIVE
PH: 6 (ref 5.0–8.0)
Protein, ur: NEGATIVE mg/dL
Specific Gravity, Urine: 1.006 (ref 1.005–1.030)
Squamous Epithelial / LPF: NONE SEEN

## 2015-08-23 LAB — BASIC METABOLIC PANEL
Anion gap: 8 (ref 5–15)
BUN: 12 mg/dL (ref 6–20)
CHLORIDE: 102 mmol/L (ref 101–111)
CO2: 28 mmol/L (ref 22–32)
CREATININE: 0.72 mg/dL (ref 0.61–1.24)
Calcium: 9.7 mg/dL (ref 8.9–10.3)
GFR calc Af Amer: >60 mL/min (ref >60)
GFR calc non Af Amer: >60 mL/min (ref >60)
Glucose, Bld: 118 mg/dL — ABNORMAL HIGH (ref 65–99)
POTASSIUM: 3.9 mmol/L (ref 3.5–5.1)
Sodium: 138 mmol/L (ref 135–145)

## 2015-08-23 LAB — CBC
HEMATOCRIT: 46.5 % (ref 40.0–52.0)
Hemoglobin: 15.6 g/dL (ref 13.0–18.0)
MCH: 32.3 pg (ref 26.0–34.0)
MCHC: 33.4 g/dL (ref 32.0–36.0)
MCV: 96.7 fL (ref 80.0–100.0)
PLATELETS: 170 10*3/uL (ref 150–440)
RBC: 4.81 MIL/uL (ref 4.40–5.90)
RDW: 13.5 % (ref 11.5–14.5)
WBC: 6.3 10*3/uL (ref 3.8–10.6)

## 2015-08-23 LAB — SEDIMENTATION RATE: Sed Rate: 3 mm/hr (ref 0–20)

## 2015-08-23 LAB — PROTIME-INR
INR: 0.96
PROTHROMBIN TIME: 13 s (ref 11.4–15.0)

## 2015-08-23 LAB — TYPE AND SCREEN
ABO/RH(D): O POS
ANTIBODY SCREEN: NEGATIVE

## 2015-08-23 LAB — ABO/RH: ABO/RH(D): O POS

## 2015-08-23 LAB — APTT: aPTT: 25 seconds (ref 24–36)

## 2015-08-23 LAB — SURGICAL PCR SCREEN
MRSA, PCR: POSITIVE — AB
STAPHYLOCOCCUS AUREUS: POSITIVE — AB

## 2015-08-23 LAB — HEMOGLOBIN A1C: HEMOGLOBIN A1C: 7.8 % — AB (ref 4.0–6.0)

## 2015-08-23 NOTE — Patient Instructions (Signed)
  Your procedure is scheduled on: 09/05/15 Report to Day Surgery. To find out your arrival time please call [phone] between 1PM - 3PM on 09/03/13.  Remember: Instructions that are not followed completely may result in serious medical risk, up to and including death, or upon the discretion of your surgeon and anesthesiologist your surgery may need to be rescheduled.    ___x_ 1. Do not eat food or drink liquids after midnight. No gum chewing or hard candies.     ___x_ 2. No Alcohol for 24 hours before or after surgery.   ___x_ 3. Bring all medications with you on the day of surgery if instructed.    ___x_ 4. Notify your doctor if there is any change in your medical condition     (cold, fever, infections).     Do not wear jewelry, make-up, hairpins, clips or nail polish.  Do not wear lotions, powders, or perfumes. You may wear deodorant.  Do not shave 48 hours prior to surgery. Men may shave face and neck.  Do not bring valuables to the hospital.    [facility] is not responsible for any belongings or valuables.               Contacts, dentures or bridgework may not be worn into surgery.  Leave your suitcase in the car. After surgery it may be brought to your room.  For patients admitted to the hospital, discharge time is determined by your                treatment team.   Patients discharged the day of surgery will not be allowed to drive home.   Please read over the following fact sheets that you were given:   MRSA Information   ____ Take these medicines the morning of surgery with A SIP OF WATER:    1. amlodipine  2.   3.   4.  5.  6.  ____ Fleet Enema (as directed)   _x___ Use CHG Soap as directed  ____ Use inhalers on the day of surgery  __x__ Stop metformin 2 days prior to surgery    ___x_ Take 1/2 of usual insulin dose the night before surgery and none on the morning of surgery.   __x__ Stop Coumadin/Plavix/aspirin on 9/6  __x__ Stop Anti-inflammatories on  9/6 Tylenol OK   ____ Stop supplements until after surgery.    ____ Bring C-Pap to the hospital.

## 2015-08-24 NOTE — OR Nursing (Signed)
Dr. Marry Guan office notified of patient pcr screen MRSA +, STAPH + via fax and voicemail left for [employer]

## 2015-08-25 LAB — URINE CULTURE: CULTURE: NO GROWTH

## 2015-08-30 MED ORDER — PROMETHAZINE HCL 25 MG/ML IJ SOLN
INTRAMUSCULAR | Status: AC
Start: 2015-08-30 — End: 2015-08-31
  Filled 2015-08-30: qty 1

## 2015-08-30 MED ORDER — SODIUM CHLORIDE 0.9 % IJ SOLN
INTRAMUSCULAR | Status: AC
  Filled 2015-08-30: qty 10

## 2015-09-04 NOTE — OR Nursing (Signed)
Message left for Bruce [name] to call back for clarification UA:[id number]  MRSA swab.

## 2015-09-05 ENCOUNTER — Inpatient Hospital Stay: Payer: BLUE CROSS/BLUE SHIELD | Admitting: Anesthesiology

## 2015-09-05 ENCOUNTER — Inpatient Hospital Stay: Payer: BLUE CROSS/BLUE SHIELD

## 2015-09-05 ENCOUNTER — Inpatient Hospital Stay
Admission: RE | Admit: 2015-09-05 | Discharge: 2015-09-08 | DRG: 470 | Disposition: A | Discharge status: Home Health | Payer: BLUE CROSS/BLUE SHIELD | Source: Ambulatory Visit | Attending: Orthopedic Surgery | Admitting: Orthopedic Surgery

## 2015-09-05 ENCOUNTER — Encounter: Payer: Self-pay | Admitting: *Deleted

## 2015-09-05 ENCOUNTER — Encounter
Admission: RE | Disposition: A | Discharge status: Home Health | Payer: Self-pay | Source: Ambulatory Visit | Attending: Orthopedic Surgery

## 2015-09-05 DIAGNOSIS — Z794 Long term (current) use of insulin: Secondary | ICD-10-CM

## 2015-09-05 DIAGNOSIS — Z86718 Personal history of other venous thrombosis and embolism: Secondary | ICD-10-CM

## 2015-09-05 DIAGNOSIS — M1711 Unilateral primary osteoarthritis, right knee: Principal | ICD-10-CM | POA: Diagnosis present

## 2015-09-05 DIAGNOSIS — E669 Obesity, unspecified: Secondary | ICD-10-CM | POA: Diagnosis present

## 2015-09-05 DIAGNOSIS — N400 Enlarged prostate without lower urinary tract symptoms: Secondary | ICD-10-CM | POA: Diagnosis present

## 2015-09-05 DIAGNOSIS — E876 Hypokalemia: Secondary | ICD-10-CM | POA: Diagnosis not present

## 2015-09-05 DIAGNOSIS — Z6829 Body mass index (BMI) 29.0-29.9, adult: Secondary | ICD-10-CM | POA: Diagnosis not present

## 2015-09-05 DIAGNOSIS — Z79899 Other long term (current) drug therapy: Secondary | ICD-10-CM | POA: Diagnosis not present

## 2015-09-05 DIAGNOSIS — Z87891 Personal history of nicotine dependence: Secondary | ICD-10-CM

## 2015-09-05 DIAGNOSIS — I10 Essential (primary) hypertension: Secondary | ICD-10-CM | POA: Diagnosis present

## 2015-09-05 DIAGNOSIS — Z7982 Long term (current) use of aspirin: Secondary | ICD-10-CM | POA: Diagnosis not present

## 2015-09-05 DIAGNOSIS — E119 Type 2 diabetes mellitus without complications: Secondary | ICD-10-CM | POA: Diagnosis present

## 2015-09-05 DIAGNOSIS — Z96659 Presence of unspecified artificial knee joint: Secondary | ICD-10-CM

## 2015-09-05 HISTORY — PX: TOTAL KNEE ARTHROPLASTY: SHX125

## 2015-09-05 LAB — GLUCOSE, CAPILLARY
GLUCOSE-CAPILLARY: 204 mg/dL — AB (ref 65–99)
GLUCOSE-CAPILLARY: 314 mg/dL — AB (ref 65–99)
Glucose-Capillary: 265 mg/dL — ABNORMAL HIGH (ref 65–99)

## 2015-09-05 SURGERY — ARTHROPLASTY, KNEE, TOTAL
Anesthesia: Spinal | Site: Knee | Laterality: Right | Wound class: Clean

## 2015-09-05 MED ORDER — LABETALOL HCL 5 MG/ML IV SOLN
INTRAVENOUS | Status: DC | PRN
  Administered 2015-09-05: 5 mg via INTRAVENOUS

## 2015-09-05 MED ORDER — SENNOSIDES-DOCUSATE SODIUM 8.6-50 MG PO TABS
1.0000 | ORAL_TABLET | Freq: Two times a day (BID) | ORAL | Status: DC
  Administered 2015-09-05 – 2015-09-08 (×6): 1 via ORAL
  Filled 2015-09-05 (×6): qty 1

## 2015-09-05 MED ORDER — PROPOFOL INFUSION 10 MG/ML OPTIME
INTRAVENOUS | Status: DC | PRN
  Administered 2015-09-05: 25 ug/kg/min via INTRAVENOUS

## 2015-09-05 MED ORDER — TAMSULOSIN HCL 0.4 MG PO CAPS
0.4000 mg | ORAL_CAPSULE | Freq: Two times a day (BID) | ORAL | Status: DC
  Administered 2015-09-05 – 2015-09-08 (×6): 0.4 mg via ORAL
  Filled 2015-09-05 (×6): qty 1

## 2015-09-05 MED ORDER — MENTHOL 3 MG MT LOZG: 1.0000 | LOZENGE | OROMUCOSAL | Status: DC | PRN

## 2015-09-05 MED ORDER — FAMOTIDINE 20 MG PO TABS
20.0000 mg | ORAL_TABLET | Freq: Once | ORAL | Status: AC
Start: 2015-09-05 — End: 2015-09-05
  Administered 2015-09-05: 20 mg via ORAL

## 2015-09-05 MED ORDER — PANTOPRAZOLE SODIUM 40 MG PO TBEC
40.0000 mg | DELAYED_RELEASE_TABLET | Freq: Two times a day (BID) | ORAL | Status: DC
Start: 2015-09-05 — End: 2015-09-08
  Administered 2015-09-05 – 2015-09-08 (×6): 40 mg via ORAL
  Filled 2015-09-05 (×6): qty 1

## 2015-09-05 MED ORDER — SODIUM CHLORIDE 0.9 % IV SOLN: Freq: Once | INTRAVENOUS | Status: DC

## 2015-09-05 MED ORDER — FERROUS SULFATE 325 (65 FE) MG PO TABS
325.0000 mg | ORAL_TABLET | Freq: Two times a day (BID) | ORAL | Status: DC
  Administered 2015-09-06 – 2015-09-08 (×5): 325 mg via ORAL
  Filled 2015-09-05 (×5): qty 1

## 2015-09-05 MED ORDER — MAGNESIUM HYDROXIDE 400 MG/5ML PO SUSP
30.0000 mL | Freq: Every day | ORAL | Status: DC | PRN
  Administered 2015-09-06: 30 mL via ORAL
  Filled 2015-09-05: qty 30

## 2015-09-05 MED ORDER — MIDAZOLAM HCL 5 MG/5ML IJ SOLN
INTRAMUSCULAR | Status: DC | PRN
  Administered 2015-09-05: 2 mg via INTRAVENOUS

## 2015-09-05 MED ORDER — ONDANSETRON HCL 4 MG/2ML IJ SOLN: 4.0000 mg | Freq: Four times a day (QID) | INTRAMUSCULAR | Status: DC | PRN

## 2015-09-05 MED ORDER — ACETAMINOPHEN 650 MG RE SUPP: 650.0000 mg | Freq: Four times a day (QID) | RECTAL | Status: DC | PRN

## 2015-09-05 MED ORDER — BRIMONIDINE TARTRATE-TIMOLOL 0.2-0.5 % OP SOLN
1.0000 [drp] | Freq: Two times a day (BID) | OPHTHALMIC | Status: DC
  Filled 2015-09-05: qty 10

## 2015-09-05 MED ORDER — GLYBURIDE 5 MG PO TABS
10.0000 mg | ORAL_TABLET | Freq: Every day | ORAL | Status: DC
  Administered 2015-09-06 – 2015-09-08 (×3): 10 mg via ORAL
  Filled 2015-09-05 (×4): qty 2

## 2015-09-05 MED ORDER — BRIMONIDINE TARTRATE 0.2 % OP SOLN
1.0000 [drp] | Freq: Two times a day (BID) | OPHTHALMIC | Status: DC
Start: 2015-09-05 — End: 2015-09-08
  Administered 2015-09-06 – 2015-09-08 (×4): 1 [drp] via OPHTHALMIC
  Filled 2015-09-05: qty 5

## 2015-09-05 MED ORDER — ATORVASTATIN CALCIUM 10 MG PO TABS
10.0000 mg | ORAL_TABLET | Freq: Every day | ORAL | Status: DC
  Administered 2015-09-05 – 2015-09-07 (×3): 10 mg via ORAL
  Filled 2015-09-05 (×3): qty 1

## 2015-09-05 MED ORDER — PHENOL 1.4 % MT LIQD: 1.0000 | OROMUCOSAL | Status: DC | PRN

## 2015-09-05 MED ORDER — VANCOMYCIN HCL IN DEXTROSE 1-5 GM/200ML-% IV SOLN
1000.0000 mg | Freq: Once | INTRAVENOUS | Status: AC
  Administered 2015-09-05: 1000 mg via INTRAVENOUS

## 2015-09-05 MED ORDER — MUPIROCIN 2 % EX OINT
1.0000 "application " | TOPICAL_OINTMENT | Freq: Two times a day (BID) | CUTANEOUS | Status: DC
  Administered 2015-09-05: 1 via NASAL
  Filled 2015-09-05: qty 22

## 2015-09-05 MED ORDER — ACETAMINOPHEN 325 MG PO TABS: 650.0000 mg | ORAL_TABLET | Freq: Four times a day (QID) | ORAL | Status: DC | PRN

## 2015-09-05 MED ORDER — TIMOLOL MALEATE 0.5 % OP SOLN
1.0000 [drp] | Freq: Two times a day (BID) | OPHTHALMIC | Status: DC
  Administered 2015-09-06 – 2015-09-08 (×4): 1 [drp] via OPHTHALMIC
  Filled 2015-09-05: qty 5

## 2015-09-05 MED ORDER — AMLODIPINE BESYLATE 5 MG PO TABS
5.0000 mg | ORAL_TABLET | Freq: Every morning | ORAL | Status: DC
  Administered 2015-09-06 – 2015-09-08 (×3): 5 mg via ORAL
  Filled 2015-09-05 (×3): qty 1

## 2015-09-05 MED ORDER — BUPIVACAINE-EPINEPHRINE 0.25% -1:200000 IJ SOLN
INTRAMUSCULAR | Status: DC | PRN
Start: 2015-09-05 — End: 2015-09-05
  Administered 2015-09-05: 30 mL

## 2015-09-05 MED ORDER — DIPHENHYDRAMINE HCL 12.5 MG/5ML PO ELIX: 12.5000 mg | ORAL_SOLUTION | ORAL | Status: DC | PRN

## 2015-09-05 MED ORDER — BISACODYL 10 MG RE SUPP: 10.0000 mg | Freq: Every day | RECTAL | Status: DC | PRN

## 2015-09-05 MED ORDER — GLYBURIDE-METFORMIN 5-500 MG PO TABS: 2.0000 | ORAL_TABLET | Freq: Every day | ORAL | Status: DC

## 2015-09-05 MED ORDER — METOCLOPRAMIDE HCL 10 MG PO TABS
10.0000 mg | ORAL_TABLET | Freq: Three times a day (TID) | ORAL | Status: AC
  Administered 2015-09-05 – 2015-09-07 (×8): 10 mg via ORAL
  Filled 2015-09-05 (×8): qty 1

## 2015-09-05 MED ORDER — TETRACAINE HCL 1 % IJ SOLN
INTRAMUSCULAR | Status: DC | PRN
  Administered 2015-09-05: .8 mg via INTRASPINAL

## 2015-09-05 MED ORDER — HYDROCHLOROTHIAZIDE 25 MG PO TABS
25.0000 mg | ORAL_TABLET | Freq: Every day | ORAL | Status: DC
  Administered 2015-09-05 – 2015-09-08 (×4): 25 mg via ORAL
  Filled 2015-09-05 (×4): qty 1

## 2015-09-05 MED ORDER — INSULIN ASPART 100 UNIT/ML ~~LOC~~ SOLN
0.0000 [IU] | Freq: Three times a day (TID) | SUBCUTANEOUS | Status: DC
  Administered 2015-09-06: 8 [IU] via SUBCUTANEOUS
  Administered 2015-09-06 – 2015-09-07 (×3): 5 [IU] via SUBCUTANEOUS
  Administered 2015-09-07: 2 [IU] via SUBCUTANEOUS
  Administered 2015-09-07: 8 [IU] via SUBCUTANEOUS
  Administered 2015-09-08: 5 [IU] via SUBCUTANEOUS
  Administered 2015-09-08: 2 [IU] via SUBCUTANEOUS
  Filled 2015-09-05 (×3): qty 5
  Filled 2015-09-05: qty 8
  Filled 2015-09-05 (×2): qty 2
  Filled 2015-09-05: qty 8
  Filled 2015-09-05: qty 5

## 2015-09-05 MED ORDER — LOSARTAN POTASSIUM 50 MG PO TABS
100.0000 mg | ORAL_TABLET | Freq: Every morning | ORAL | Status: DC
  Administered 2015-09-06 – 2015-09-08 (×3): 100 mg via ORAL
  Filled 2015-09-05 (×3): qty 2

## 2015-09-05 MED ORDER — ENOXAPARIN SODIUM 30 MG/0.3ML ~~LOC~~ SOLN
30.0000 mg | Freq: Two times a day (BID) | SUBCUTANEOUS | Status: DC
  Administered 2015-09-06 – 2015-09-08 (×5): 30 mg via SUBCUTANEOUS
  Filled 2015-09-05 (×5): qty 0.3

## 2015-09-05 MED ORDER — BUPIVACAINE HCL (PF) 0.75 % IJ SOLN
INTRAMUSCULAR | Status: DC | PRN
  Administered 2015-09-05: 1.8 mg

## 2015-09-05 MED ORDER — LACTATED RINGERS IV SOLN
INTRAVENOUS | Status: DC | PRN
  Administered 2015-09-05: 16:00:00 via INTRAVENOUS

## 2015-09-05 MED ORDER — FENTANYL CITRATE (PF) 100 MCG/2ML IJ SOLN
INTRAMUSCULAR | Status: DC | PRN
  Administered 2015-09-05 (×8): 25 ug via INTRAVENOUS

## 2015-09-05 MED ORDER — ONDANSETRON HCL 4 MG/2ML IJ SOLN: 4.0000 mg | Freq: Once | INTRAMUSCULAR | Status: DC | PRN

## 2015-09-05 MED ORDER — METFORMIN HCL 500 MG PO TABS
1000.0000 mg | ORAL_TABLET | Freq: Every day | ORAL | Status: DC
  Administered 2015-09-06 – 2015-09-08 (×3): 1000 mg via ORAL
  Filled 2015-09-05 (×3): qty 2

## 2015-09-05 MED ORDER — FLEET ENEMA 7-19 GM/118ML RE ENEM: 1.0000 | ENEMA | Freq: Once | RECTAL | Status: DC | PRN

## 2015-09-05 MED ORDER — ONDANSETRON HCL 4 MG PO TABS: 4.0000 mg | ORAL_TABLET | Freq: Four times a day (QID) | ORAL | Status: DC | PRN

## 2015-09-05 MED ORDER — SODIUM CHLORIDE 0.9 % IV SOLN
INTRAVENOUS | Status: DC
  Administered 2015-09-05 – 2015-09-06 (×2): via INTRAVENOUS

## 2015-09-05 MED ORDER — LOSARTAN POTASSIUM-HCTZ 100-25 MG PO TABS: 1.0000 | ORAL_TABLET | Freq: Every morning | ORAL | Status: DC

## 2015-09-05 MED ORDER — CEFAZOLIN SODIUM-DEXTROSE 2-3 GM-% IV SOLR
2.0000 g | Freq: Once | INTRAVENOUS | Status: AC
  Administered 2015-09-05: 2 g via INTRAVENOUS

## 2015-09-05 MED ORDER — MORPHINE SULFATE (PF) 2 MG/ML IV SOLN
2.0000 mg | INTRAVENOUS | Status: DC | PRN
  Administered 2015-09-05 (×4): 2 mg via INTRAVENOUS
  Filled 2015-09-05 (×2): qty 1
  Filled 2015-09-05: qty 2
  Filled 2015-09-05: qty 1
  Filled 2015-09-05: qty 2

## 2015-09-05 MED ORDER — ACETAMINOPHEN 10 MG/ML IV SOLN
1000.0000 mg | Freq: Four times a day (QID) | INTRAVENOUS | Status: AC
  Administered 2015-09-05 – 2015-09-06 (×3): 1000 mg via INTRAVENOUS
  Filled 2015-09-05 (×4): qty 100

## 2015-09-05 MED ORDER — FENTANYL CITRATE (PF) 100 MCG/2ML IJ SOLN
25.0000 ug | INTRAMUSCULAR | Status: AC | PRN
  Administered 2015-09-05 (×6): 25 ug via INTRAVENOUS

## 2015-09-05 MED ORDER — TRAMADOL HCL 50 MG PO TABS: 50.0000 mg | ORAL_TABLET | ORAL | Status: DC | PRN

## 2015-09-05 MED ORDER — OXYCODONE HCL 5 MG PO TABS
5.0000 mg | ORAL_TABLET | ORAL | Status: DC | PRN
  Administered 2015-09-05 – 2015-09-06 (×5): 5 mg via ORAL
  Filled 2015-09-05 (×5): qty 1

## 2015-09-05 MED ORDER — CELECOXIB 200 MG PO CAPS
200.0000 mg | ORAL_CAPSULE | Freq: Two times a day (BID) | ORAL | Status: DC
  Administered 2015-09-05 – 2015-09-08 (×5): 200 mg via ORAL
  Filled 2015-09-05 (×6): qty 1

## 2015-09-05 MED ORDER — FINASTERIDE 5 MG PO TABS
5.0000 mg | ORAL_TABLET | Freq: Every morning | ORAL | Status: DC
  Administered 2015-09-06 – 2015-09-08 (×3): 5 mg via ORAL
  Filled 2015-09-05 (×3): qty 1

## 2015-09-05 MED ORDER — ALUM & MAG HYDROXIDE-SIMETH 200-200-20 MG/5ML PO SUSP: 30.0000 mL | ORAL | Status: DC | PRN

## 2015-09-05 MED ORDER — LINAGLIPTIN 5 MG PO TABS
5.0000 mg | ORAL_TABLET | Freq: Every day | ORAL | Status: DC
  Administered 2015-09-07 – 2015-09-08 (×2): 5 mg via ORAL
  Filled 2015-09-05 (×3): qty 1

## 2015-09-05 MED ORDER — CHLORHEXIDINE GLUCONATE CLOTH 2 % EX PADS
6.0000 | MEDICATED_PAD | Freq: Every day | CUTANEOUS | Status: DC
  Administered 2015-09-06: 6 via TOPICAL

## 2015-09-05 MED ORDER — INSULIN DETEMIR 100 UNIT/ML ~~LOC~~ SOLN
10.0000 [IU] | Freq: Every day | SUBCUTANEOUS | Status: DC
  Administered 2015-09-05 – 2015-09-07 (×3): 10 [IU] via SUBCUTANEOUS
  Filled 2015-09-05 (×4): qty 0.1

## 2015-09-05 MED ORDER — CEFAZOLIN SODIUM-DEXTROSE 2-3 GM-% IV SOLR
2.0000 g | Freq: Four times a day (QID) | INTRAVENOUS | Status: AC
  Administered 2015-09-06 (×3): 2 g via INTRAVENOUS
  Filled 2015-09-05 (×4): qty 50

## 2015-09-05 MED ORDER — SODIUM CHLORIDE 0.9 % IV SOLN
INTRAVENOUS | Status: DC | PRN
  Administered 2015-09-05: 60 mL

## 2015-09-05 MED ORDER — SODIUM CHLORIDE 0.9 % IV SOLN
INTRAVENOUS | Status: DC
  Administered 2015-09-05 (×2): via INTRAVENOUS

## 2015-09-05 MED ORDER — LATANOPROST 0.005 % OP SOLN
1.0000 [drp] | Freq: Every day | OPHTHALMIC | Status: DC
  Administered 2015-09-05 – 2015-09-07 (×3): 1 [drp] via OPHTHALMIC
  Filled 2015-09-05: qty 2.5

## 2015-09-05 MED ORDER — NEOMYCIN-POLYMYXIN B GU 40-200000 IR SOLN
Status: DC | PRN
Start: 2015-09-05 — End: 2015-09-05
  Administered 2015-09-05: 14 mL

## 2015-09-05 SURGICAL SUPPLY — 57 items
AUTOTRANSFUS HAS 1/8 (MISCELLANEOUS) ×3
BATTERY INSTRU NAVIGATION (MISCELLANEOUS) ×12 IMPLANT
BLADE SAW 1 (BLADE) ×3 IMPLANT
BLADE SAW 1/2 (BLADE) ×3 IMPLANT
BONE CEMENT GENTAMICIN (Cement) ×6 IMPLANT
CANISTER SUCT 1200ML W/VALVE (MISCELLANEOUS) ×3 IMPLANT
CANISTER SUCT 3000ML (MISCELLANEOUS) ×6 IMPLANT
CAP KNEE TOTAL 3 SIGMA ×3 IMPLANT
CATH TRAY METER 16FR LF (MISCELLANEOUS) ×3 IMPLANT
CEMENT BONE GENTAMICIN 40 (Cement) ×2 IMPLANT
COOLER POLAR GLACIER W/PUMP (MISCELLANEOUS) ×3 IMPLANT
DRAPE SHEET LG 3/4 BI-LAMINATE (DRAPES) ×3 IMPLANT
DRSG DERMACEA 8X12 NADH (GAUZE/BANDAGES/DRESSINGS) ×3 IMPLANT
DRSG OPSITE POSTOP 4X14 (GAUZE/BANDAGES/DRESSINGS) ×3 IMPLANT
DURAPREP 26ML APPLICATOR (WOUND CARE) ×6 IMPLANT
ELECT CAUTERY BLADE 6.4 (BLADE) ×3 IMPLANT
EX-PIN ORTHOLOCK NAV 4X150 (PIN) ×6 IMPLANT
GLOVE BIOGEL M STRL SZ7.5 (GLOVE) ×6 IMPLANT
GLOVE INDICATOR 8.0 STRL GRN (GLOVE) ×3 IMPLANT
GLOVE SURG 9.0 ORTHO LTXF (GLOVE) ×3 IMPLANT
GLOVE SURG ORTHO 9.0 STRL STRW (GLOVE) ×3 IMPLANT
GOWN STRL REUS W/ TWL LRG LVL3 (GOWN DISPOSABLE) ×1 IMPLANT
GOWN STRL REUS W/ TWL LRG LVL4 (GOWN DISPOSABLE) ×1 IMPLANT
GOWN STRL REUS W/TWL 2XL LVL3 (GOWN DISPOSABLE) ×3 IMPLANT
GOWN STRL REUS W/TWL LRG LVL3 (GOWN DISPOSABLE) ×2
GOWN STRL REUS W/TWL LRG LVL4 (GOWN DISPOSABLE) ×2
HANDPIECE SUCTION TUBG SURGILV (MISCELLANEOUS) ×3 IMPLANT
HOLDER FOLEY CATH W/STRAP (MISCELLANEOUS) ×3 IMPLANT
HOOD PEEL AWAY FACE SHEILD DIS (HOOD) ×6 IMPLANT
KNIFE SCULPS 14X20 (INSTRUMENTS) ×3 IMPLANT
NDL SAFETY 18GX1.5 (NEEDLE) ×3 IMPLANT
NEEDLE SPNL 20GX3.5 QUINCKE YW (NEEDLE) ×3 IMPLANT
NS IRRIG 500ML POUR BTL (IV SOLUTION) ×3 IMPLANT
PACK TOTAL KNEE (MISCELLANEOUS) ×3 IMPLANT
PAD GROUND ADULT SPLIT (MISCELLANEOUS) ×3 IMPLANT
PAD WRAPON POLAR KNEE (MISCELLANEOUS) ×1 IMPLANT
PIN DRILL QUICK PACK ×3 IMPLANT
PIN FIXATION 1/8DIA X 3INL (PIN) ×3 IMPLANT
SOL .9 NS 3000ML IRR  AL (IV SOLUTION) ×2
SOL .9 NS 3000ML IRR UROMATIC (IV SOLUTION) ×1 IMPLANT
SOL PREP PVP 2OZ (MISCELLANEOUS) ×3
SOLUTION PREP PVP 2OZ (MISCELLANEOUS) ×1 IMPLANT
SPONGE DRAIN TRACH 4X4 STRL 2S (GAUZE/BANDAGES/DRESSINGS) ×3 IMPLANT
STAPLER SKIN PROX 35W (STAPLE) ×3 IMPLANT
STRAP SAFETY BODY (MISCELLANEOUS) ×3 IMPLANT
SUCTION FRAZIER TIP 10 FR DISP (SUCTIONS) ×3 IMPLANT
SUT VIC AB 0 CT1 36 (SUTURE) ×3 IMPLANT
SUT VIC AB 1 CT1 36 (SUTURE) ×6 IMPLANT
SUT VIC AB 2-0 CT2 27 (SUTURE) ×3 IMPLANT
SYR 20CC LL (SYRINGE) ×3 IMPLANT
SYR 30ML LL (SYRINGE) ×3 IMPLANT
SYR 50ML LL SCALE MARK (SYRINGE) ×3 IMPLANT
SYSTEM AUTOTRANSFUS DUAL TROCR (MISCELLANEOUS) ×1 IMPLANT
TOWEL OR 17X26 4PK STRL BLUE (TOWEL DISPOSABLE) ×3 IMPLANT
TOWER CARTRIDGE SMART MIX (DISPOSABLE) ×3 IMPLANT
WATER STERILE IRR 1000ML POUR (IV SOLUTION) ×3 IMPLANT
WRAPON POLAR PAD KNEE (MISCELLANEOUS) ×3

## 2015-09-05 NOTE — H&P (Signed)
The patient has been re-examined, and the chart reviewed, and there have been no interval changes to the documented history and physical.    The risks, benefits, and alternatives have been discussed at length. The patient expressed understanding of the risks benefits and agreed with plans for surgical intervention.  James P. Hooten, Jr. M.D.    

## 2015-09-05 NOTE — Brief Op Note (Signed)
09/05/2015  4:32 PM  PATIENT:  Bruce [name]  64 y.o. male  PRE-OPERATIVE DIAGNOSIS:  DEGENERATIVE OSTEOARTHRITIS right knee  POST-OPERATIVE DIAGNOSIS:  same  PROCEDURE:  Procedure(s): TOTAL KNEE ARTHROPLASTY (Right) using computer-assisted navigation  SURGEON:  Surgeon(s) and Role:    * Dereck Leep, MD - Primary  ASSISTANTS: Vance Peper, PA    ANESTHESIA:   spinal  EBL:  Total I/O In: 1100 [I.V.:1100] Out: 300 [Urine:300]  BLOOD ADMINISTERED:none  DRAINS: 2 drains to reinfusion system   LOCAL MEDICATIONS USED:  MARCAINE    and OTHER Exparel  SPECIMEN:  No Specimen  DISPOSITION OF SPECIMEN:  N/A  COUNTS:  YES  TOURNIQUET:  120 minutes  DICTATION: .Dragon Dictation  PLAN OF CARE: Admit to inpatient   PATIENT DISPOSITION:  PACU - hemodynamically stable.   Delay start of Pharmacological VTE agent (>24hrs) due to surgical blood loss or risk of bleeding: yes

## 2015-09-05 NOTE — Progress Notes (Signed)
Pt. Instructed on how to use IS. IS at the bedside.

## 2015-09-05 NOTE — Progress Notes (Signed)
Pt. Dangled at the bedside. Did great.

## 2015-09-05 NOTE — OR Nursing (Signed)
Blood sugar of 265 on arrival .  Informed dr. Ermalinda Memos of lab value.  Okay to proceed per anesthesiologist

## 2015-09-05 NOTE — Anesthesia Preprocedure Evaluation (Signed)
Anesthesia Evaluation  Patient identified by MRN, date of birth, ID band Patient awake    Reviewed: Allergy & Precautions, NPO status , Patient's Chart, lab work & pertinent test results  History of Anesthesia Complications (+) PONV  Airway Mallampati: III       Dental no notable dental hx.    Pulmonary former smoker,     + decreased breath sounds      Cardiovascular hypertension, Pt. on medications Normal cardiovascular exam     Neuro/Psych negative neurological ROS  negative psych ROS   GI/Hepatic negative GI ROS,   Endo/Other  diabetes, Type 1, Insulin Dependent  Renal/GU      Musculoskeletal   Abdominal (+) + obese,   Peds  Hematology negative hematology ROS (+)   Anesthesia Other Findings   Reproductive/Obstetrics                             Anesthesia Physical Anesthesia Plan  ASA: III  Anesthesia Plan: Spinal   Post-op Pain Management:    Induction: Intravenous  Airway Management Planned: Nasal Cannula  Additional Equipment:   Intra-op Plan:   Post-operative Plan:   Informed Consent: I have reviewed the patients History and Physical, chart, labs and discussed the procedure including the risks, benefits and alternatives for the proposed anesthesia with the patient or authorized representative who has indicated his/her understanding and acceptance.     Plan Discussed with: CRNA  Anesthesia Plan Comments:         Anesthesia Quick Evaluation

## 2015-09-05 NOTE — Transfer of Care (Signed)
Immediate Anesthesia Transfer of Care Note  Patient: Bruce [name]  Procedure(s) Performed: Procedure(s): TOTAL KNEE ARTHROPLASTY (Right)  Patient Location: PACU  Anesthesia Type:Spinal  Level of Consciousness: awake, alert  and oriented  Airway & Oxygen Therapy: Patient Spontanous Breathing  Post-op Assessment: Report given to RN and Post -op Vital signs reviewed and stable  Post vital signs: Reviewed and stable  Last Vitals:  Filed Vitals:   09/05/15 0923  BP: 169/100  Pulse: 78  Temp: 36.8 C  Resp: 16    Complications: No apparent anesthesia complications

## 2015-09-05 NOTE — Anesthesia Procedure Notes (Signed)
Spinal Patient location during procedure: OR Start time: 09/05/2015 12:00 PM End time: 09/05/2015 12:09 PM Staffing Anesthesiologist: Marline Backbone F Performed by: anesthesiologist  Preanesthetic Checklist Completed: patient identified, site marked, surgical consent, pre-op evaluation, timeout performed, IV checked, risks and benefits discussed and monitors and equipment checked Spinal Block Patient position: sitting Prep: Betadine Patient monitoring: heart rate and blood pressure Approach: midline Location: L3-4 Injection technique: single-shot Needle Needle type: Quincke  Needle gauge: 22 G Needle length: 9 cm Needle insertion depth: 7 cm Assessment Sensory level: T8

## 2015-09-05 NOTE — Op Note (Signed)
OPERATIVE NOTE  DATE OF SURGERY:  09/05/2015  PATIENT NAME:  Bruce [name]   DOB: 03/01/1951  MRN: [id number]  PRE-OPERATIVE DIAGNOSIS: Degenerative arthrosis of the right knee, primary  POST-OPERATIVE DIAGNOSIS:  Same  PROCEDURE:  Right total knee arthroplasty using computer-assisted navigation  SURGEON:  Marciano Sequin. M.D.  ASSISTANT:  Vance Peper, PA (present and scrubbed throughout the case, critical for assistance with exposure, retraction, instrumentation, and closure)  ANESTHESIA: spinal  ESTIMATED BLOOD LOSS: 50 mL  FLUIDS REPLACED: 1000 mL of crystalloid  TOURNIQUET TIME: 120 minutes  DRAINS: 2 medium drains to a reinfusion system  SOFT TISSUE RELEASES: Anterior cruciate ligament, posterior cruciate ligament, deep medial collateral ligament, patellofemoral ligament   IMPLANTS UTILIZED: DePuy PFC Sigma size 4 posterior stabilized femoral component (cemented), size 4 MBT tibial component (cemented), 41 mm 3 peg oval dome patella (cemented), and a 10 mm stabilized rotating platform polyethylene insert.  INDICATIONS FOR SURGERY: Bruce [name] is a 64 y.o. year old male with a long history of progressive knee pain. X-rays demonstrated severe degenerative changes in tricompartmental fashion. The patient had not seen any significant improvement despite conservative nonsurgical intervention. After discussion of the risks and benefits of surgical intervention, the patient expressed understanding of the risks benefits and agree with plans for total knee arthroplasty.   The risks, benefits, and alternatives were discussed at length including but not limited to the risks of infection, bleeding, nerve injury, stiffness, blood clots, the need for revision surgery, cardiopulmonary complications, among others, and they were willing to proceed.  PROCEDURE IN DETAIL: The patient was brought into the operating room and, after adequate spinal anesthesia was achieved, a tourniquet was  placed on the patient's upper thigh. The patient's knee and leg were cleaned and prepped with alcohol and DuraPrep and draped in the usual sterile fashion. A "timeout" was performed as per usual protocol. The lower extremity was exsanguinated using an Esmarch, and the tourniquet was inflated to 300 mmHg. An anterior longitudinal incision was made followed by a standard mid vastus approach. The deep fibers of the medial collateral ligament were elevated in a subperiosteal fashion off of the medial flare of the tibia so as to maintain a continuous soft tissue sleeve. The patella was subluxed laterally and the patellofemoral ligament was incised. Inspection of the knee demonstrated severe degenerative changes with full-thickness loss of articular cartilage. Osteophytes were debrided using a rongeur. Anterior and posterior cruciate ligaments were excised. Two 4.0 mm Schanz pins were inserted in the femur and into the tibia for attachment of the array of trackers used for computer-assisted navigation. Hip center was identified using a circumduction technique. Distal landmarks were mapped using the computer. The distal femur and proximal tibia were mapped using the computer. The distal femoral cutting guide was positioned using computer-assisted navigation so as to achieve a 5 distal valgus cut. The femur was sized and it was felt that a size 4 femoral component was appropriate. A size 4 femoral cutting guide was positioned and the anterior cut was performed and verified using the computer. This was followed by completion of the posterior and chamfer cuts. Femoral cutting guide for the central box was then positioned in the center box cut was performed.  Attention was then directed to the proximal tibia. Medial and lateral menisci were excised. The extramedullary tibial cutting guide was positioned using computer-assisted navigation so as to achieve a 0 varus-valgus alignment and 0 posterior slope. The cut was  performed and verified  using the computer. The proximal tibia was sized and it was felt that a size 4 tibial tray was appropriate. Tibial and femoral trials were inserted followed by insertion of a 10 mm polyethylene insert. The knee was felt to be tight both in flexion and extension. Trial components were removed and the tibial cutting guide was repositioned so as to resect an additional 2 mm of bone. Trial components were reinserted and the knee was placed through range of motion. This allowed for excellent mediolateral soft tissue balancing both in flexion and in full extension. Finally, the patella was cut and prepared so as to accommodate a 41 mm 3 peg oval dome patella. A patella trial was placed and the knee was placed through a range of motion with excellent patellar tracking appreciated. The femoral trial was removed after debridement of posterior osteophytes. The central post-hole for the tibial component was reamed followed by insertion of a keel punch. Tibial trials were then removed. Cut surfaces of bone were irrigated with copious amounts of normal saline with antibiotic solution using pulsatile lavage and then suctioned dry. Polymethylmethacrylate cement with gentamicin was prepared in the usual fashion using a vacuum mixer. Cement was applied to the cut surface of the proximal tibia as well as along the undersurface of a size 4 MBT tibial component. Tibial component was positioned and impacted into place. Excess cement was removed using [employer] fast streamer. Cement was then applied to the cut surfaces of the femur as well as along the posterior flanges of the size 4 femoral component. The femoral component was positioned and impacted into place. Excess cement was removed using [employer] fast streamer. A 10 mm polyethylene trial was inserted and the knee was brought into full extension with steady axial compression applied. Finally, cement was applied to the backside of a 41 mm 3 peg oval dome patella and the  patellar component was positioned and patellar clamp applied. Excess cement was removed using [employer] fast streamer. After adequate curing of the cement, the tourniquet was deflated after a total tourniquet time of 120 minutes. Hemostasis was achieved using electrocautery. The knee was irrigated with copious amounts of normal saline with antibiotic solution using pulsatile lavage and then suctioned dry. 20 mL of 1.3% Exparel in 40 mL of normal saline was injected along the posterior capsule, medial and lateral gutters, and along the arthrotomy site. A 10 mm stabilized rotating platform polyethylene insert was inserted and the knee was placed through a range of motion with excellent mediolateral soft tissue balancing appreciated and excellent patellar tracking noted. 2 medium drains were placed in the wound bed and brought out through separate stab incisions to be attached to a reinfusion system. The medial parapatellar portion of the incision was reapproximated using interrupted sutures of #1 Vicryl. Subcutaneous tissue was then injected with a total of 30 cc of 0.25% Marcaine with epinephrine. Subcutaneous tissue was approximated in layers using first #0 Vicryl followed #2-0 Vicryl. The skin was approximated with skin staples. A sterile dressing was applied.  The patient tolerated the procedure well and was transported to the recovery room in stable condition.    Athen Riel P. Holley Bouche., M.D.

## 2015-09-05 NOTE — Progress Notes (Signed)
Autovac drained 90cc. Converted to hemovac.

## 2015-09-06 ENCOUNTER — Encounter: Payer: Self-pay | Admitting: Orthopedic Surgery

## 2015-09-06 LAB — BASIC METABOLIC PANEL
Anion gap: 5 (ref 5–15)
BUN: 7 mg/dL (ref 6–20)
CO2: 28 mmol/L (ref 22–32)
Calcium: 8.1 mg/dL — ABNORMAL LOW (ref 8.9–10.3)
Chloride: 101 mmol/L (ref 101–111)
Creatinine, Ser: 0.74 mg/dL (ref 0.61–1.24)
GFR calc Af Amer: >60 mL/min (ref >60)
GLUCOSE: 235 mg/dL — AB (ref 65–99)
POTASSIUM: 3.1 mmol/L — AB (ref 3.5–5.1)
Sodium: 134 mmol/L — ABNORMAL LOW (ref 135–145)

## 2015-09-06 LAB — GLUCOSE, CAPILLARY
GLUCOSE-CAPILLARY: 245 mg/dL — AB (ref 65–99)
Glucose-Capillary: 204 mg/dL — ABNORMAL HIGH (ref 65–99)
Glucose-Capillary: 252 mg/dL — ABNORMAL HIGH (ref 65–99)
Glucose-Capillary: 187 mg/dL — ABNORMAL HIGH (ref 65–99)

## 2015-09-06 LAB — CBC
HEMATOCRIT: 37.8 % — AB (ref 40.0–52.0)
Hemoglobin: 13.2 g/dL (ref 13.0–18.0)
MCH: 33.3 pg (ref 26.0–34.0)
MCHC: 34.9 g/dL (ref 32.0–36.0)
MCV: 95.4 fL (ref 80.0–100.0)
PLATELETS: 152 10*3/uL (ref 150–440)
RBC: 3.97 MIL/uL — AB (ref 4.40–5.90)
RDW: 13 % (ref 11.5–14.5)
WBC: 8.9 10*3/uL (ref 3.8–10.6)

## 2015-09-06 MED ORDER — TRAMADOL HCL 50 MG PO TABS: 50.0000 mg | ORAL_TABLET | ORAL | Status: DC | PRN

## 2015-09-06 MED ORDER — POTASSIUM CHLORIDE 20 MEQ PO PACK
20.0000 meq | PACK | Freq: Three times a day (TID) | ORAL | Status: AC
  Administered 2015-09-06 (×3): 20 meq via ORAL
  Filled 2015-09-06 (×3): qty 1

## 2015-09-06 MED ORDER — OXYCODONE HCL 5 MG PO TABS: 5.0000 mg | ORAL_TABLET | ORAL | Status: DC | PRN

## 2015-09-06 MED ORDER — ENOXAPARIN SODIUM 40 MG/0.4ML ~~LOC~~ SOLN: 40.0000 mg | SUBCUTANEOUS | Status: DC

## 2015-09-06 NOTE — Discharge Instructions (Signed)

## 2015-09-06 NOTE — Anesthesia Postprocedure Evaluation (Signed)
  Anesthesia Post-op Note  Patient: Bruce [name]  Procedure(s) Performed: Procedure(s): TOTAL KNEE ARTHROPLASTY (Right)  Anesthesia type:Spinal  Patient location: 135A  Post pain: Pain level controlled  Post assessment: Post-op Vital signs reviewed, Patient's Cardiovascular Status Stable, Respiratory Function Stable, Patent Airway and No signs of Nausea or vomiting  Post vital signs: Reviewed and stable  Last Vitals:  Filed Vitals:   09/06/15 0344  BP: 115/60  Pulse: 79  Temp: 36.7 C  Resp:     Level of consciousness: awake, alert  and patient cooperative  Complications: No apparent anesthesia complications

## 2015-09-06 NOTE — Care Management Note (Addendum)
Case Management Note  Patient Details  Name: Bruce [name] MRN: [id number] Date of Birth: 07/19/1951  Subjective/Objective:                  Met with patient and his wife to discuss discharge planning. He plans to return home like he did with his other joint replacement. PT is recommending HHPT currently. He has a front-wheeled walker. He denies other DME needs. He would like to use [facility] like before. He uses [facility] [id number] for Rx.  Action/Plan: List of home health care agencies left with patient. Referral made to [facility]. Lovenox 3m #14 called in to [facility] for price. RNCM will continue to follow.   Expected Discharge Date:                  Expected Discharge Plan:     In-House Referral:     Discharge planning Services  CM Consult  Post Acute Care Choice:  Home Health Choice offered to:  Patient, Spouse  DME Arranged:  N/A DME Agency:     HH Arranged:  PT HH Agency:  [facility] Status of Service:  In process, will continue to follow  Medicare Important Message Given:    Date Medicare IM Given:    Medicare IM give by:    Date Additional Medicare IM Given:    Additional Medicare Important Message give by:     [place], [place] discussed:    Additional Comments: Lovenox $12    09/06/2015, 10:05 AM

## 2015-09-06 NOTE — Progress Notes (Signed)
CSW consult for possible SNF, patient evaluated by PT with recommendation for Home Health.  RN care manager is following patient to set up needed services.   CSW will continue to monitor in the event recommendation from PT changes to SNF.  Bruce [name]. [name], MSW Clinical Social Work Department Emergency Room [phone] 2:15 PM

## 2015-09-06 NOTE — Evaluation (Signed)
Occupational Therapy Evaluation Patient Details Name: CHONG WOJDYLA MRN: [id number] DOB: [birth year] Today's Date: 09/06/2015    History of Present Illness This patient is a 64 year old male who came to [facility] for a R TKR   Clinical Impression   This patient is a 64 year old male who came to [facility] for a R total knee replacement.  Patient lives in a  home with his wife.  He had been independent with ADL and functional mobility. He now requires some assistance and would benefit from Occupational Therapy for a full dressing activity.      Follow Up Recommendations       Equipment Recommendations       Recommendations for Other Services       Precautions / Restrictions Precautions Precautions: Fall Restrictions Weight Bearing Restrictions: Yes RLE Weight Bearing: Weight bearing as tolerated      Mobility Bed Mobility                  Transfers                      Balance                                            ADL                                         General ADL Comments: Patient had been independent with ADL. Patient practiced Donned/doffed socks and pants to knees (drain and wrap still in place) with set up verbal cues only. He used hip kit today but most likely will not need when wrap is removed.     Vision     Perception     Praxis      Pertinent Vitals/Pain Pain Assessment: 0-10 Pain Score: 2  Pain Location: R knee     Hand Dominance     Extremity/Trunk Assessment Upper Extremity Assessment Upper Extremity Assessment: Overall WFL for tasks assessed   Lower Extremity Assessment Lower Extremity Assessment: Defer to PT evaluation       Communication Communication Communication: No difficulties   Cognition Arousal/Alertness: Awake/alert Behavior During Therapy: WFL for tasks assessed/performed Overall Cognitive Status: Within Functional Limits for tasks  assessed                     General Comments       Exercises       Shoulder Instructions      Home Living Family/patient expects to be discharged to:: Private residence Living Arrangements: Spouse/significant other Available Help at Discharge: Family Type of Home: House       Home Layout: One level                          Prior Functioning/Environment Level of Independence: Independent             OT Diagnosis: Acute pain   OT Problem List:     OT Treatment/Interventions: Self-care/ADL training    OT Goals(Current goals can be found in the care plan section) Acute Rehab OT Goals Patient Stated Goal: To go home OT Goal Formulation: With patient/family Time For Goal Achievement: 09/20/15 Potential to  Achieve Goals: Good  OT Frequency: Min 1X/week   Barriers to D/C:            Co-evaluation              End of Session Equipment Utilized During Treatment:  (hip kit)  Activity Tolerance:   Patient left: in chair;with call bell/phone within reach;with chair alarm set;with family/visitor present   Time: 8350-7573 OT Time Calculation (min): 24 min Charges:  OT General Charges $OT Visit: 1 Procedure OT Evaluation $Initial OT Evaluation Tier I: 1 Procedure OT Treatments $Self Care/Home Management : 8-22 mins G-Codes:    Myrene Galas, MS/OTR/L  09/06/2015, 12:02 PM

## 2015-09-06 NOTE — Evaluation (Signed)
Physical Therapy Evaluation Patient Details Name: Bruce [name] MRN: [id number] DOB: Feb 14, 1951 Today's Date: 09/06/2015   History of Present Illness  This patient is a 64 year old male who came to [facility] for a R TKR  Clinical Impression  Pt presents with hx of PONV, DM, DVT, HTN, and arthritis. Examination reveals that pt performs bed mobility at supervision, transfers at supervision, and ambulation at [facility]. Pt has good functional strength and initial ROM, along with only minor discomfort. He is very pleasant to work with and motivated to participate in therapy. He will benefit from skilled PT in order to address his ROM, strength, and gait deficits in order for him to return home safely.     Follow Up Recommendations Home health PT;Supervision - Intermittent    Equipment Recommendations  None recommended by PT (has RW)    Recommendations for Other Services       Precautions / Restrictions Precautions Precautions: Fall Restrictions Weight Bearing Restrictions: No RLE Weight Bearing: Weight bearing as tolerated      Mobility  Bed Mobility Overal bed mobility: Needs Assistance Bed Mobility: Supine to Sit     Supine to sit: Supervision     General bed mobility comments: Pt with good functional strength and use of hands to get to EOB. Supervision for management of LE  Transfers Overall transfer level: Needs assistance Equipment used: Rolling walker (2 wheeled) Transfers: Sit to/from Stand Sit to Stand: Supervision         General transfer comment: pt performs transfer with good strength and rise into standing. No unsteadiness or LOB. Pt has good knowledge of use of RW from previous TKA on other side   Ambulation/Gait Ambulation/Gait assistance: Min guard Ambulation Distance (Feet): 20 Feet (F/B ambulation) Assistive device: Rolling walker (2 wheeled) Gait Pattern/deviations: Step-to pattern;Decreased step length - right;Decreased step length - left;Shuffle Gait  velocity: decreased  Gait velocity interpretation: Below normal speed for age/gender General Gait Details: Pt able to perform 10 AROM SLRs successfully, therefore KI not donned for mobility. Pt requires cues with ambulation for sequencing of RW with gait. He shows good stability with no buckling or LOB with ambulation  Stairs            Wheelchair Mobility    Modified Rankin (Stroke Patients Only)       Balance Overall balance assessment: No apparent balance deficits (not formally assessed)                                           Pertinent Vitals/Pain Pain Assessment: No/denies pain Pain Score: 2  Pain Location: R knee    Home Living Family/patient expects to be discharged to:: Private residence Living Arrangements: Spouse/significant other Available Help at Discharge: Family;Available 24 hours/day (Wife available ) Type of Home: House Home Access: Stairs to enter Entrance Stairs-Rails: Can reach both Entrance Stairs-Number of Steps: 3 Home Layout: One level        Prior Function Level of Independence: Independent         Comments: Pt was indep with ADLs, ambulation and driving     Hand Dominance        Extremity/Trunk Assessment   Upper Extremity Assessment: Overall WFL for tasks assessed           Lower Extremity Assessment: RLE deficits/detail RLE Deficits / Details: Gross RLE MMT at least 3/5  Communication   Communication: No difficulties  Cognition Arousal/Alertness: Awake/alert Behavior During Therapy: WFL for tasks assessed/performed Overall Cognitive Status: Within Functional Limits for tasks assessed                      General Comments      Exercises Total Joint Exercises Goniometric ROM: R knee AAROM: 2 - 92 degrees  Other Exercises Other Exercises: Pt performed bilateral therex x 10 reps with supervision for proper technique. Exercises performed: ankle pumps, quad sets, glute sets, SLR, hip  abd       Assessment/Plan    PT Assessment Patient needs continued PT services  PT Diagnosis Difficulty walking;Abnormality of gait;Acute pain   PT Problem List Decreased strength;Decreased range of motion;Decreased activity tolerance;Decreased knowledge of use of DME;Pain  PT Treatment Interventions DME instruction;Gait training;Stair training;Functional mobility training;Therapeutic activities;Balance training;Therapeutic exercise;Neuromuscular re-education;Patient/family education;Manual techniques;Wheelchair mobility training   PT Goals (Current goals can be found in the Care Plan section) Acute Rehab PT Goals Patient Stated Goal: To go home PT Goal Formulation: With patient Time For Goal Achievement: 09/20/15 Potential to Achieve Goals: Good    Frequency BID   Barriers to discharge        Co-evaluation               End of Session Equipment Utilized During Treatment: Gait belt Activity Tolerance: Patient tolerated treatment well Patient left: in chair;with call bell/phone within reach;with chair alarm set;with SCD's reapplied Nurse Communication: Mobility status         Time: 6659-9357 PT Time Calculation (min) (ACUTE ONLY): 26 min   Charges:         PT G CodesJanyth Contes September 07, 2015, 12:53 PM Janyth Contes, SPT. [phone]

## 2015-09-06 NOTE — Progress Notes (Signed)
Pt. Resting quietly with eyes closed after receiving IV tylenol, IV morphine and being repositioned. Will continue to monitor.

## 2015-09-06 NOTE — Progress Notes (Signed)
Pts temp 101.1 MD H[place]en notified. Pt instructed to use incentive spirometer. Will continue to monitor.

## 2015-09-06 NOTE — Plan of Care (Signed)
Problem: Consults Goal: Diagnosis- Total Joint Replacement Outcome: Completed/Met Date Met:  09/06/15 Primary Total Knee

## 2015-09-06 NOTE — Progress Notes (Signed)
POD 1. Pt. Alert and oriented. VSS Pain remains under control throughout the night with meds per MAR. Foley patent and will be removed this morning. Dressing clean dry and intact. Pt. Dangled yesterday evening and did well with that. Autovac converted to hemovac with autovac only putting out 90cc. Neurochecks WDL. Polarcare on and running. Heels elevated on towel rolls. Pt. Tolerating PO's. Resting quietly at this time.

## 2015-09-06 NOTE — Progress Notes (Signed)
Inpatient Diabetes Program Recommendations  AACE/ADA: New Consensus Statement on Inpatient Glycemic Control (2015)  Target Ranges:  Prepandial:   less than 140 mg/dL      Peak postprandial:   less than 180 mg/dL (1-2 hours)      Critically ill patients:  140 - 180 mg/dL   Review of Glycemic Control  Diabetes history: Type 2 Outpatient Diabetes medications: Glyburide /Metformin 5/500mg  2 qam, Levemir 10 units qpm, Januvia 100mg /day Current orders for Inpatient glycemic control: Glyburide 10mg /day, Levemir 10 units qhs, Tradjenta 5mg /day, Novolog 0-15 units tid, Metformin 1000mg  qam  Inpatient Diabetes Program Recommendations:     Consider adding Novolog 3 units tid with meals (along with Novolog correction already ordered.)  Hold if patient eats less than 50%.  Consider increasing Levemir to 15 units qhs  (0.15 units/kg)  Gentry Fitz, RN, [place], , CDE Diabetes Coordinator Inpatient Diabetes Program  [phone] (Team Pager) [phone] ([place]) 09/06/2015 4:08 PM

## 2015-09-06 NOTE — Clinical Documentation Improvement (Addendum)
Orthopedics  Please document in the progress notes (not on the query form itself), any associated diagnosis/condition the patient has or may have:   Hypokalemia  Other  Unable to Clinically Determine  Supporting Information: "Potassium 3.0. will supplement." is documented in the progress note 09/06/15 by Watt Climes PA Klor Con 20 mEq tid ordered 09/06/15.   Please exercise your independent, professional judgment when responding. A specific answer is not anticipated or expected.   Thank You, Erling Conte  RN BSN [place] [phone]

## 2015-09-06 NOTE — Progress Notes (Signed)
Physical Therapy Treatment Patient Details Name: Bruce [name] MRN: [id number] DOB: Jul 09, 1951 Today's Date: 09/06/2015    History of Present Illness This patient is a 64 year old male who came to [facility] for a R TKR    PT Comments    Pt making excellent progress this afternoon, showing greater ability to ambulate and perform functional mobility. Pt is very close to independent mobility but still needs occasional bed rails and cues to use RW with ambulation. Pt remains pleasant to work with and motivated to participate in rehab. He will continue to benefit from skilled PT in order to address his strength, ROM, and gait deficits.   Follow Up Recommendations  Home health PT;Supervision - Intermittent     Equipment Recommendations  None recommended by PT    Recommendations for Other Services       Precautions / Restrictions Precautions Precautions: Fall Restrictions Weight Bearing Restrictions: No RLE Weight Bearing: Weight bearing as tolerated    Mobility  Bed Mobility Overal bed mobility: Needs Assistance Bed Mobility: Sit to Supine     Supine to sit: Supervision Sit to supine: Supervision   General bed mobility comments: Pt able to control trunk and lower into bed, as well as lift and control LEs. Supervision for management of RLE  Transfers Overall transfer level: Needs assistance Equipment used: Rolling walker (2 wheeled) Transfers: Sit to/from Stand Sit to Stand: Supervision         General transfer comment: pt performs transfer with good strength and rise into standing. No unsteadiness or LOB. Pt has good knowledge of use of RW from previous TKA on other side  (improved ease of standing this afternoon)  Ambulation/Gait Ambulation/Gait assistance: Min guard Ambulation Distance (Feet): 40 Feet Assistive device: Rolling walker (2 wheeled) Gait Pattern/deviations: Decreased step length - right;Decreased step length - left;Step-to pattern;Antalgic Gait  velocity: decreased  Gait velocity interpretation: Below normal speed for age/gender General Gait Details: Pt requiring cues for shortening of step on R leg in order to create more symmetrical and reciprocal pattern. Pt shows no fatigue or unsteadiness during ambulation   Stairs            Wheelchair Mobility    Modified Rankin (Stroke Patients Only)       Balance Overall balance assessment: No apparent balance deficits (not formally assessed)                                  Cognition Arousal/Alertness: Awake/alert Behavior During Therapy: WFL for tasks assessed/performed Overall Cognitive Status: Within Functional Limits for tasks assessed                      Exercises Total Joint Exercises Goniometric ROM: R knee AAROM: 2 - 92 degrees  Other Exercises Other Exercises: Pt performed bilateral therex x 10 reps with supervision for proper technique. Exercises performed: ankle pumps, quad sets, glute sets, SLR, hip abd     General Comments        Pertinent Vitals/Pain Pain Assessment: No/denies pain Pain Score: 2  Pain Location: R knee    Home Living Family/patient expects to be discharged to:: Private residence Living Arrangements: Spouse/significant other Available Help at Discharge: Family;Available 24 hours/day (Wife available ) Type of Home: House Home Access: Stairs to enter Entrance Stairs-Rails: Can reach both Home Layout: One level        Prior Function Level of Independence: Independent  Comments: Pt was indep with ADLs, ambulation and driving   PT Goals (current goals can now be found in the care plan section) Acute Rehab PT Goals Patient Stated Goal: to walk further PT Goal Formulation: With patient Time For Goal Achievement: 09/20/15 Potential to Achieve Goals: Good Progress towards PT goals: Progressing toward goals    Frequency  BID    PT Plan Current plan remains appropriate    Co-evaluation              End of Session Equipment Utilized During Treatment: Gait belt Activity Tolerance: Patient tolerated treatment well Patient left: in bed;with call bell/phone within reach;with bed alarm set;with nursing/sitter in room;with SCD's reapplied     Time: 1335-1400 PT Time Calculation (min) (ACUTE ONLY): 25 min  Charges:  $Therapeutic Exercise: 8-22 mins                    G CodesJanyth Contes Sep 25, 2015, 3:25 PM  Janyth Contes, SPT. [phone]

## 2015-09-06 NOTE — Progress Notes (Signed)
   Subjective: 1 Day Post-Op Procedure(s) (LRB): TOTAL KNEE ARTHROPLASTY (Right) Patient reports pain as mild.   Patient is well, and has had no acute complaints or problems We will start therapy today.  Plan is to go Home after hospital stay. no nausea and no vomiting Patient denies any chest pains or shortness of breath. Objective: Vital signs in last 24 hours: Temp:  [97.4 F (36.3 C)-99.6 F (37.6 C)] 98 F (36.7 C) (09/15 0344) Pulse Rate:  [55-89] 79 (09/15 0344) Resp:  [10-18] 18 (09/14 2035) BP: (115-178)/(60-100) 115/60 mmHg (09/15 0344) SpO2:  [97 %-100 %] 99 % (09/15 0344) Weight:  [99.791 kg (220 lb)] 99.791 kg (220 lb) (09/14 1809) Patient has original dressing on postoperative and unable to evaluate wound. Heels are non tender and elevated off the bed using rolled towels Intake/Output from previous day: 09/14 0701 - 09/15 0700 In: 3011.7 [P.O.:240; I.V.:2471.7; IV Piggyback:300] Out: 3250 [Urine:2950; Drains:300] Intake/Output this shift:     Recent Labs  09/06/15 0453  HGB 13.2    Recent Labs  09/06/15 0453  WBC 8.9  RBC 3.97*  HCT 37.8*  PLT 152    Recent Labs  09/06/15 0453  NA 134*  K 3.1*  CL 101  CO2 28  BUN 7  CREATININE 0.74  GLUCOSE 235*  CALCIUM 8.1*   No results for input(s): LABPT, INR in the last 72 hours.  EXAM General - Patient is Alert, Appropriate and Oriented Extremity - Neurologically intact Neurovascular intact Sensation intact distally Intact pulses distally Dorsiflexion/Plantar flexion intact Dressing - dressing C/D/I Motor Function - intact, moving foot and toes well on exam. Able to do straight leg raise on his own. Is working on range of motion of the knee.  Past Medical History  Diagnosis Date  . PONV (postoperative nausea and vomiting)   . Diabetes mellitus without complication   . DVT (deep venous thrombosis) 2011  . Hypertension   . Arthritis     Assessment/Plan: 1 Day Post-Op Procedure(s)  (LRB): TOTAL KNEE ARTHROPLASTY (Right) Active Problems:   Total knee replacement status  Estimated body mass index is 29.83 kg/(m^2) as calculated from the following:   Height as of this encounter: 6' (1.829 m).   Weight as of this encounter: 99.791 kg (220 lb). Advance diet Up with therapy Discharge home with home health on Saturday  Labs: Were reviewed. Potassium 3.0. will supplement. DVT Prophylaxis - Lovenox, Foot Pumps and TED hose Weight-Bearing as tolerated to right leg Need to start working on a bowel movement today.    Jillyn Ledger. [place] 09/06/2015, 7:54 AM

## 2015-09-07 LAB — BASIC METABOLIC PANEL
ANION GAP: 5 (ref 5–15)
BUN: 8 mg/dL (ref 6–20)
CHLORIDE: 104 mmol/L (ref 101–111)
CO2: 28 mmol/L (ref 22–32)
CREATININE: 0.7 mg/dL (ref 0.61–1.24)
Calcium: 8.1 mg/dL — ABNORMAL LOW (ref 8.9–10.3)
GFR calc non Af Amer: >60 mL/min (ref >60)
Glucose, Bld: 143 mg/dL — ABNORMAL HIGH (ref 65–99)
Potassium: 3.7 mmol/L (ref 3.5–5.1)
SODIUM: 137 mmol/L (ref 135–145)

## 2015-09-07 LAB — CBC
HEMATOCRIT: 34.9 % — AB (ref 40.0–52.0)
HEMOGLOBIN: 12 g/dL — AB (ref 13.0–18.0)
MCH: 32.7 pg (ref 26.0–34.0)
MCHC: 34.3 g/dL (ref 32.0–36.0)
MCV: 95.4 fL (ref 80.0–100.0)
Platelets: 137 10*3/uL — ABNORMAL LOW (ref 150–440)
RBC: 3.66 MIL/uL — AB (ref 4.40–5.90)
RDW: 12.8 % (ref 11.5–14.5)
WBC: 7.5 10*3/uL (ref 3.8–10.6)

## 2015-09-07 LAB — GLUCOSE, CAPILLARY
GLUCOSE-CAPILLARY: 137 mg/dL — AB (ref 65–99)
GLUCOSE-CAPILLARY: 222 mg/dL — AB (ref 65–99)
GLUCOSE-CAPILLARY: 262 mg/dL — AB (ref 65–99)
Glucose-Capillary: 150 mg/dL — ABNORMAL HIGH (ref 65–99)

## 2015-09-07 MED ORDER — LACTULOSE 10 GM/15ML PO SOLN
10.0000 g | Freq: Two times a day (BID) | ORAL | Status: AC | PRN
  Administered 2015-09-07: 10 g via ORAL
  Filled 2015-09-07: qty 30

## 2015-09-07 NOTE — Progress Notes (Signed)
Physical Therapy Treatment Patient Details Name: Bruce [name] MRN: [id number] DOB: [birth year] Today's Date: 09/07/2015    History of Present Illness This patient is a 64 year old male who came to [facility] for a R TKR    PT Comments    Pt is able to ambulate >200 ft w/o issue and is able to negotiate up/down 4 steps with single and double rails.  He has very little pain, shows almost no fatigue with the effort and is able to perform all R LE strength exercises against resistance.   Follow Up Recommendations  Home health PT     Equipment Recommendations       Recommendations for Other Services       Precautions / Restrictions Precautions Precautions: Fall Restrictions RLE Weight Bearing: Weight bearing as tolerated    Mobility  Bed Mobility Overal bed mobility: Independent Bed Mobility: Supine to Sit     Supine to sit: Independent Sit to supine: Independent   General bed mobility comments: Pt with good confidence with   Transfers Overall transfer level: Modified independent Equipment used: Rolling walker (2 wheeled) Transfers: Sit to/from Stand Sit to Stand: Supervision         General transfer comment: Pt able to rise w/o issues using the walker, good confidence.  Ambulation/Gait Ambulation/Gait assistance: Supervision Ambulation Distance (Feet): 250 Feet Assistive device: Rolling walker (2 wheeled)       General Gait Details: Pt shows good confidence and very minimal hesitation with R WBing. He has very little fatigue and has no LOBs.  Issued a more appropriate walker (taller) an pt reports feeling much better with it. `   Stairs Stairs: Yes Stairs assistance: Modified independent (Device/Increase time) Stair Management: One rail Right;Two rails Number of Stairs: 4 General stair comments: Pt goes up 4 steps with 2 and single rail with no issues and does not need direct physical assist.  Wheelchair Mobility    Modified Rankin (Stroke Patients  Only)       Balance                                    Cognition Arousal/Alertness: Awake/alert Behavior During Therapy: WFL for tasks assessed/performed Overall Cognitive Status: Within Functional Limits for tasks assessed                      Exercises Total Joint Exercises Quad Sets: Strengthening;10 reps Short Arc Quad: Strengthening;10 reps Heel Slides: Strengthening;AROM;10 reps Hip ABduction/ADduction: AROM;10 reps Straight Leg Raises: AROM;10 reps Goniometric ROM: >100 degrees of flexion    General Comments        Pertinent Vitals/Pain Pain Score: 2     Home Living                      Prior Function            PT Goals (current goals can now be found in the care plan section) Progress towards PT goals: Progressing toward goals    Frequency  BID    PT Plan Current plan remains appropriate    Co-evaluation             End of Session Equipment Utilized During Treatment: Gait belt Activity Tolerance: Patient tolerated treatment well Patient left: with bed alarm set     Time: 1441-1510 PT Time Calculation (min) (ACUTE ONLY): 29 min  Charges:  $Gait  Training: 8-22 mins $Therapeutic Exercise: 8-22 mins                    G Codes:     Wayne Both, [place], DPT [phone]  Kreg Shropshire 09/07/2015, 5:46 PM

## 2015-09-07 NOTE — Discharge Summary (Signed)
Physician Discharge Summary  Patient ID: Bruce [name] MRN: [id number] DOB/AGE: 1951-01-09 64 y.o.  Admit date: 09/05/2015 Discharge date: 09/07/2015  Admission Diagnoses:  DEGENERATIVE OSTEOARTHRITIS   Discharge Diagnoses: Patient Active Problem List   Diagnosis Date Noted  . Total knee replacement status 09/05/2015    Past Medical History  Diagnosis Date  . PONV (postoperative nausea and vomiting)   . Diabetes mellitus without complication   . DVT (deep venous thrombosis) 2011  . Hypertension   . Arthritis      Transfusion: Autovac transfusions given the first 6 hours postoperatively   Consultants (if any):   case management for home  health assistance  Discharged Condition: Improved  Hospital Course: [name] is an 64 y.o. male who was admitted 09/05/2015 with a diagnosis of degenerative arthrosis right knee and went to the operating room on 09/05/2015 and underwent the above named procedures.   Hypokalemia secondary to volume. Corrected with K+   Surgeries:Procedure(s): TOTAL KNEE ARTHROPLASTY on 09/05/2015  ANESTHESIA: spinal  ESTIMATED BLOOD LOSS: 50 mL  FLUIDS REPLACED: 1000 mL of crystalloid  TOURNIQUET TIME: 120 minutes  DRAINS: 2 medium drains to a reinfusion system  SOFT TISSUE RELEASES: Anterior cruciate ligament, posterior cruciate ligament, deep medial collateral ligament, patellofemoral ligament   IMPLANTS UTILIZED: DePuy PFC Sigma size 4 posterior stabilized femoral component (cemented), size 4 MBT tibial component (cemented), 41 mm 3 peg oval dome patella (cemented), and a 10 mm stabilized rotating platform polyethylene insert.  INDICATIONS FOR SURGERY: DEDDRICK SAINDON is a 64 y.o. year old male with a long history of progressive knee pain. X-rays demonstrated severe degenerative changes in tricompartmental fashion. The patient had not seen any significant improvement despite conservative nonsurgical intervention. After discussion of the  risks and benefits of surgical intervention, the patient expressed understanding of the risks benefits and agree with plans for total knee arthroplasty.   The risks, benefits, and alternatives were discussed at length including but not limited to the risks of infection, bleeding, nerve injury, stiffness, blood clots, the need for revision surgery, cardiopulmonary complications, among others, and they were willing to proceed. Patient tolerated the surgery well. No complications .Patient was taken to PACU where she was stabilized and then transferred to the orthopedic floor.  Patient started on Lovenox 30 q 12 hrs. Foot pumps applied bilaterally at 80 mm hg . Heels elevated off bed with rolled towels. No evidence of DVT. Calves non tender. Negative Homan. Physical therapy started on day #1 for gait training and transfer with OT starting on  day #1 for ADL and assisted devices. Patient has done well with therapy. Ambulated greater than 200 feet upon being discharged. Was able to independently and safely go up 4 steps  Patient's IV , foley and hemovac was d/c on day #2.   He was given perioperative antibiotics:  Anti-infectives    Start     Dose/Rate Route Frequency Ordered Stop   09/05/15 1815  ceFAZolin (ANCEF) IVPB 2 g/50 mL premix     2 g 100 mL/hr over 30 Minutes Intravenous Every 6 hours 09/05/15 1810 09/06/15 1814   09/05/15 0215  vancomycin (VANCOCIN) IVPB 1000 mg/200 mL premix     1,000 mg 200 mL/hr over 60 Minutes Intravenous  Once 09/05/15 0209 09/05/15 1039   09/05/15 0215  ceFAZolin (ANCEF) IVPB 2 g/50 mL premix     2 g 100 mL/hr over 30 Minutes Intravenous  Once 09/05/15 0209 09/05/15 1248    .  He  was fitted with AV 1 compression foot pump devices, early ambulation, and TED stockings and Lovenox for DVT prophylaxis.  He benefited maximally from the hospital stay and there were no complications.    Recent vital signs:  Filed Vitals:   09/06/15 1900  BP: 140/66  Pulse: 80   Temp: 97.8 F (36.6 C)  Resp: 17    Recent laboratory studies:  Lab Results  Component Value Date   HGB 12.0* 09/07/2015   HGB 13.2 09/06/2015   HGB 15.6 08/23/2015   Lab Results  Component Value Date   WBC 7.5 09/07/2015   PLT 137* 09/07/2015   Lab Results  Component Value Date   INR 0.96 08/23/2015   Lab Results  Component Value Date   NA 134* 09/06/2015   K 3.1* 09/06/2015   CL 101 09/06/2015   CO2 28 09/06/2015   BUN 7 09/06/2015   CREATININE 0.74 09/06/2015   GLUCOSE 235* 09/06/2015    Discharge Medications:     Medication List    STOP taking these medications        aspirin 81 MG tablet      TAKE these medications        amLODipine 5 MG tablet  Commonly known as:  NORVASC  Take 5 mg by mouth every morning.     atorvastatin 10 MG tablet  Commonly known as:  LIPITOR  Take 10 mg by mouth at bedtime.     COMBIGAN 0.2-0.5 % ophthalmic solution  Generic drug:  brimonidine-timolol  Place 1 drop into both eyes every 12 (twelve) hours.     enoxaparin 40 MG/0.4ML injection  Commonly known as:  LOVENOX  Inject 0.4 mLs (40 mg total) into the skin daily.     finasteride 5 MG tablet  Commonly known as:  PROSCAR  Take 5 mg by mouth every morning.     glyBURIDE-metformin 5-500 MG per tablet  Commonly known as:  GLUCOVANCE  Take 2 tablets by mouth daily with breakfast.     insulin detemir 100 UNIT/ML injection  Commonly known as:  LEVEMIR  Inject 10 Units into the skin at bedtime.     latanoprost 0.005 % ophthalmic solution  Commonly known as:  XALATAN  1 drop at bedtime.     losartan-hydrochlorothiazide 100-25 MG per tablet  Commonly known as:  HYZAAR  Take 1 tablet by mouth every morning.     oxyCODONE 5 MG immediate release tablet  Commonly known as:  Oxy IR/ROXICODONE  Take 1-2 tablets (5-10 mg total) by mouth every 4 (four) hours as needed for severe pain or breakthrough pain.     sitaGLIPtin 100 MG tablet  Commonly known as:  JANUVIA   Take 100 mg by mouth every morning.     tamsulosin 0.4 MG Caps capsule  Commonly known as:  FLOMAX  Take 0.4 mg by mouth 2 (two) times daily.     traMADol 50 MG tablet  Commonly known as:  ULTRAM  Take 1-2 tablets (50-100 mg total) by mouth every 4 (four) hours as needed for moderate pain.        Diagnostic Studies: Dg Knee Right Port  09/05/2015   CLINICAL DATA:  Total knee replacement.  EXAM: PORTABLE RIGHT KNEE - 1-2 VIEW  COMPARISON:  None.  FINDINGS: Total right knee replacement is identified without malalignment. A drain is identified in the knee joint. Postsurgical air and skin staples are identified.  IMPRESSION: Total right knee replacement without malalignment.   Electronically Signed  By: Abelardo Diesel M.D.   On: 09/05/2015 17:40    Disposition:       Discharge Instructions    Diet - low sodium heart healthy    Complete by:  As directed      Increase activity slowly    Complete by:  As directed            Follow-up Information    Follow up with [facility] R., PA On 09/20/2015.   Specialty:  Physician Assistant   Why:  at 9:15am   Contact information:   [place] [address] [phone]       Follow up with Dereck Leep, MD On 10/23/2015.   Specialty:  Orthopedic Surgery   Why:  at 3:15pm   Contact information:   [place] [address] [phone]        Signed: Watt Climes 09/07/2015, 7:35 AM

## 2015-09-07 NOTE — Progress Notes (Signed)
Pt. Alert and oriented. VSS. Pain controlled with PO pain meds. MOM administered at HS. Pt. Encouraged to use IS. Using urinal. Heels elevated off of bed. Rested quietly throughout the night. Will continue to monitor.

## 2015-09-07 NOTE — Progress Notes (Signed)
   Subjective: 2 Days Post-Op Procedure(s) (LRB): TOTAL KNEE ARTHROPLASTY (Right) Patient reports pain as 2 on 0-10 scale.   Patient is well, and has had no acute complaints or problems Continue with physical therapy today.  Plan is to go Home after hospital stay. no nausea and no vomiting Patient denies any chest pains or shortness of breath. Objective: Vital signs in last 24 hours: Temp:  [96.5 F (35.8 C)-101.1 F (38.4 C)] 97.8 F (36.6 C) (09/15 1900) Pulse Rate:  [74-81] 80 (09/15 1900) Resp:  [16-17] 17 (09/15 1900) BP: (122-140)/(64-66) 140/66 mmHg (09/15 1900) SpO2:  [98 %-99 %] 99 % (09/15 1900) well approximated incision Heels are non tender and elevated off the bed using rolled towels Intake/Output from previous day: 09/15 0701 - 09/16 0700 In: 702 [P.O.:702] Out: 2645 [Urine:2075; Drains:570] Intake/Output this shift:     Recent Labs  09/06/15 0453 09/07/15 0408  HGB 13.2 12.0*    Recent Labs  09/06/15 0453 09/07/15 0408  WBC 8.9 7.5  RBC 3.97* 3.66*  HCT 37.8* 34.9*  PLT 152 137*    Recent Labs  09/06/15 0453  NA 134*  K 3.1*  CL 101  CO2 28  BUN 7  CREATININE 0.74  GLUCOSE 235*  CALCIUM 8.1*   No results for input(s): LABPT, INR in the last 72 hours.  EXAM General - Patient is Alert, Appropriate and Oriented Extremity - Neurologically intact Neurovascular intact Sensation intact distally Intact pulses distally Dorsiflexion/Plantar flexion intact Dressing - dressing C/D/I Motor Function - intact, moving foot and toes well on exam. Able to do SLR on own  Past Medical History  Diagnosis Date  . PONV (postoperative nausea and vomiting)   . Diabetes mellitus without complication   . DVT (deep venous thrombosis) 2011  . Hypertension   . Arthritis     Assessment/Plan: 2 Days Post-Op Procedure(s) (LRB): TOTAL KNEE ARTHROPLASTY (Right) Active Problems:   Total knee replacement status  Estimated body mass index is 29.83  kg/(m^2) as calculated from the following:   Height as of this encounter: 6' (1.829 m).   Weight as of this encounter: 99.791 kg (220 lb). Up with therapy Plan for discharge tomorrow Discharge home with home health  Labs: reviewed. Met b ordered for 8 am DVT Prophylaxis - Lovenox, Foot Pumps and TED hose Weight-Bearing as tolerated to right leg hemovac discontinued Needs a bowel movement today     Jon R. [facility] 09/07/2015, 7:29 AM

## 2015-09-07 NOTE — Progress Notes (Signed)
Orthopaedics- TO CLARIFY THE PROGRESS NOTE FROM 09/06/2015 (TO SATISFY [facility] CODERS):  PATIENT'S POTASIUM WAS 3.1 WHICH, BY THE HOSPITAL'S LABORATORY CRITERIA, IS LOW.  BY DEFINITION, A LOW POTASIUM IS "HYPOKALEMIA".  Appropriate treatment intervention was ordered.  James P. Holley Bouche M.D.

## 2015-09-07 NOTE — Progress Notes (Signed)
Occupational Therapy Treatment Patient Details Name: Bruce [name] MRN: [id number] DOB: March 22, 1951 Today's Date: 09/07/2015    History of present illness This patient is a 64 year old male who came to [facility] for a R TKR   OT comments  Patient demonstrated ability to dress self with no physical help, and did not use hip kit. No further Occupational Therapy needed.   Follow Up Recommendations    Home with home health Physical Therapy only, no further Occupational Therapy needed.    Equipment Recommendations       Recommendations for Other Services      Precautions / Restrictions Precautions Precautions: Fall Restrictions RLE Weight Bearing: Weight bearing as tolerated       Mobility Bed Mobility Overal bed mobility: Modified Independent Bed Mobility: Supine to Sit     Supine to sit:  (contact guard and use of walker)        Transfers                      Balance                                   ADL                                         General ADL Comments: .Patient dressed lower body with supervision, contact guard assist, and one verbal cue for technique. Patient did so with out use of hip kit, only rolling walker for standing and pulling up pants.      Vision                     Perception     Praxis      Cognition   Behavior During Therapy: WFL for tasks assessed/performed Overall Cognitive Status: Within Functional Limits for tasks assessed                       Extremity/Trunk Assessment               Exercises     Shoulder Instructions       General Comments      Pertinent Vitals/ Pain          Home Living                                          Prior Functioning/Environment              Frequency       Progress Toward Goals  OT Goals(current goals can now be found in the care plan section)        Plan      Co-evaluation                  End of Session Equipment Utilized During Treatment:  (hip kit available but not needed.)   Activity Tolerance     Patient Left in bed;with call bell/phone within reach;with bed alarm set;with family/visitor present   Nurse Communication          Time: 4388-8757 OT Time Calculation (min): 10 min  Charges: OT General Charges $OT Visit: 1 Procedure OT Treatments $Self Care/Home  Management : 8-22 mins Sharon Mt, MS/OTR/L  Sharon Mt 09/07/2015, 3:48 PM

## 2015-09-07 NOTE — Progress Notes (Signed)
Physical Therapy Treatment Patient Details Name: Bruce [name] MRN: [id number] DOB: [birth year] Today's Date: 09/07/2015    History of Present Illness This patient is a 64 year old male who came to [facility] for a R TKR    PT Comments    Pt again performing well with therapy with much greater ambulation distance, gait performance and relatively little complaints of pain. Pt is very motivated to progress in his therapy and is pleasant to work with. He still has ROM, strength and gait deficits that will continue to benefit from skilled PT in order for him to return home safely. He also has a strong support system in his wife, who has demonstrated an interest in learning about his rehabilitation process.   Follow Up Recommendations  Home health PT;Supervision - Intermittent     Equipment Recommendations  None recommended by PT    Recommendations for Other Services       Precautions / Restrictions Precautions Precautions: Fall Restrictions Weight Bearing Restrictions: No RLE Weight Bearing: Weight bearing as tolerated    Mobility  Bed Mobility Overal bed mobility: Modified Independent Bed Mobility: Supine to Sit     Supine to sit: Modified independent (Device/Increase time)     General bed mobility comments: Pt has the strength to lift and control his RLE during bed mobility and mobilize himself to EOB  (Use of handrails )  Transfers Overall transfer level: Needs assistance Equipment used: Rolling walker (2 wheeled) Transfers: Sit to/from Stand Sit to Stand: Supervision         General transfer comment: Pt demonstrates good use of hands during transfer and good strength to get into standing. Supervision for safety within the hospital. Pt with good stability and balance in standing  Ambulation/Gait Ambulation/Gait assistance: Supervision Ambulation Distance (Feet): 220 Feet Assistive device: Rolling walker (2 wheeled) Gait Pattern/deviations: Step-to  pattern;Step-through pattern;Decreased step length - right;Decreased step length - left Gait velocity: decreased  Gait velocity interpretation: Below normal speed for age/gender General Gait Details: Pt required demonstration of step length in order to create symmetrical, step-through pattern rather than step-to pattern. Once he received the visual demonstration he was able to consistently ambulate with reciprocal pattern for the remaining 80% of his ambulation distance. Pt stable in gait, but still has decreased gait speed   Stairs            Wheelchair Mobility    Modified Rankin (Stroke Patients Only)       Balance Overall balance assessment: No apparent balance deficits (not formally assessed)                                  Cognition Arousal/Alertness: Awake/alert Behavior During Therapy: WFL for tasks assessed/performed Overall Cognitive Status: Within Functional Limits for tasks assessed                      Exercises Total Joint Exercises Goniometric ROM: R knee AAROM: 0 - 108 degrees  Other Exercises Other Exercises: Pt performed bilateral therex x 15 reps with supervision for proper technique. Exercises performed: ankle pumps, quad sets, glute sets, SLR, hip abd, and LAQ     General Comments        Pertinent Vitals/Pain Pain Assessment: 0-10 Pain Score: 2  Pain Location: R knee    Home Living  Prior Function            PT Goals (current goals can now be found in the care plan section) Acute Rehab PT Goals Patient Stated Goal: to get moving PT Goal Formulation: With patient Time For Goal Achievement: 09/20/15 Potential to Achieve Goals: Good Progress towards PT goals: Progressing toward goals    Frequency  BID    PT Plan Current plan remains appropriate    Co-evaluation             End of Session Equipment Utilized During Treatment: Gait belt Activity Tolerance: Patient tolerated  treatment well Patient left: in chair;with call bell/phone within reach;with chair alarm set;with family/visitor present;with SCD's reapplied     Time: 9373-4287 PT Time Calculation (min) (ACUTE ONLY): 24 min  Charges:                       G CodesJanyth Contes 2015-09-13, 10:57 AM Janyth Contes, SPT. [phone]

## 2015-09-08 LAB — GLUCOSE, CAPILLARY
GLUCOSE-CAPILLARY: 141 mg/dL — AB (ref 65–99)
GLUCOSE-CAPILLARY: 215 mg/dL — AB (ref 65–99)

## 2015-09-08 LAB — CBC
HCT: 33.8 % — ABNORMAL LOW (ref 40.0–52.0)
Hemoglobin: 11.9 g/dL — ABNORMAL LOW (ref 13.0–18.0)
MCH: 33.8 pg (ref 26.0–34.0)
MCHC: 35.3 g/dL (ref 32.0–36.0)
MCV: 95.9 fL (ref 80.0–100.0)
PLATELETS: 134 10*3/uL — AB (ref 150–440)
RBC: 3.52 MIL/uL — ABNORMAL LOW (ref 4.40–5.90)
RDW: 13 % (ref 11.5–14.5)
WBC: 7.8 10*3/uL (ref 3.8–10.6)

## 2015-09-08 NOTE — Care Management Note (Signed)
Case Management Note  Patient Details  Name: Bruce [name] MRN: [id number] Date of Birth: 1951-12-09  Subjective/Objective:    [name] has DME equipment from previous surgery. Referral faxed to [facility] requesting PT.                 Action/Plan:   Expected Discharge Date:                  Expected Discharge Plan:     In-House Referral:     Discharge planning Services  CM Consult  Post Acute Care Choice:  Home Health Choice offered to:  Patient, Spouse  DME Arranged:  N/A DME Agency:     HH Arranged:  PT HH Agency:  [facility]  Status of Service:  In process, will continue to follow  Medicare Important Message Given:    Date Medicare IM Given:    Medicare IM give by:    Date Additional Medicare IM Given:    Additional Medicare Important Message give by:     If discussed at [place] of Stay Meetings, dates discussed:    Additional Comments:  Rockett,Marilyn A, RN 09/08/2015, 9:38 AM

## 2015-09-08 NOTE — Care Management Note (Signed)
Case Management Note  Patient Details  Name: Bruce [name] MRN: [id number] Date of Birth: 09-23-1951  Subjective/Objective:         [name] referral was faxed to [facility] and message left for Bruce [name] requesting home health PT services. Bruce [name] already has DME required equipment.            Action/Plan:   Expected Discharge Date:                  Expected Discharge Plan:     In-House Referral:     Discharge planning Services  CM Consult  Post Acute Care Choice:  Home Health Choice offered to:  Patient, Spouse  DME Arranged:  N/[name] DME Agency:     HH Arranged:  PT HH Agency:  [facility]  Status of Service:  In process, will continue to follow  Medicare Important Message Given:    Date Medicare IM Given:    Medicare IM give by:    Date Additional Medicare IM Given:    Additional Medicare Important Message give by:     If discussed at [place] of Stay Meetings, dates discussed:    Additional Comments:  Bruce [name],Bruce A, RN 09/08/2015, 3:40 PM

## 2015-09-08 NOTE — Progress Notes (Signed)
   Subjective: 3 Days Post-Op Procedure(s) (LRB): TOTAL KNEE ARTHROPLASTY (Right) Patient reports pain as 0/10. Patient is well, and has had no acute complaints or problems Continue with physical therapy today.  Plan is to go Home today no nausea and no vomiting Patient denies any chest pains or shortness of breath. Objective: Vital signs in last 24 hours: Temp:  [97.1 F (36.2 C)-98.4 F (36.9 C)] 98.4 F (36.9 C) (09/17 0717) Pulse Rate:  [70-89] 76 (09/17 0717) Resp:  [18] 18 (09/17 0717) BP: (128-143)/(62-77) 143/77 mmHg (09/17 0717) SpO2:  [97 %-100 %] 100 % (09/17 0717)  Intake/Output from previous day: 09/16 0701 - 09/17 0700 In: 240 [P.O.:240] Out: 701 [Urine:700; Stool:1] Intake/Output this shift: Total I/O In: -  Out: 400 [Urine:400]   Recent Labs  09/06/15 0453 09/07/15 0408 09/08/15 0308  HGB 13.2 12.0* 11.9*    Recent Labs  09/07/15 0408 09/08/15 0308  WBC 7.5 7.8  RBC 3.66* 3.52*  HCT 34.9* 33.8*  PLT 137* 134*    Recent Labs  09/06/15 0453 09/07/15 0410  NA 134* 137  K 3.1* 3.7  CL 101 104  CO2 28 28  BUN 7 8  CREATININE 0.74 0.70  GLUCOSE 235* 143*  CALCIUM 8.1* 8.1*   No results for input(s): LABPT, INR in the last 72 hours.  EXAM General - Patient is Alert, Appropriate and Oriented Extremity - Neurologically intact Neurovascular intact Sensation intact distally Intact pulses distally Dorsiflexion/Plantar flexion intact  well approximated incision Heels are non tender and elevated off the bed using rolled towels Dressing - dressing C/D/I Motor Function - intact, moving foot and toes well on exam. Able to do SLR on own  Past Medical History  Diagnosis Date  . PONV (postoperative nausea and vomiting)   . Diabetes mellitus without complication   . DVT (deep venous thrombosis) 2011  . Hypertension   . Arthritis     Assessment/Plan: 3 Days Post-Op Procedure(s) (LRB): TOTAL KNEE ARTHROPLASTY (Right) Active Problems:  Total knee replacement status  Estimated body mass index is 29.83 kg/(m^2) as calculated from the following:   Height as of this encounter: 6' (1.829 m).   Weight as of this encounter: 220 lb (99.791 kg). Up with therapy Discharge home with home healthtoday Follow up with [place] orth[facility] in 2 weeks  Labs: reviewed. Met b ordered for 8 am DVT Prophylaxis - Lovenox, Foot Pumps and TED hose Weight-Bearing as tolerated to right leg      Duanne Guess PA-C [place] 09/08/2015, 9:05 AM

## 2018-09-03 ENCOUNTER — Other Ambulatory Visit: Payer: Self-pay

## 2018-09-03 ENCOUNTER — Encounter: Payer: Self-pay | Admitting: Urology

## 2018-09-03 ENCOUNTER — Ambulatory Visit (INDEPENDENT_AMBULATORY_CARE_PROVIDER_SITE_OTHER): Payer: Medicare HMO | Admitting: Urology

## 2018-09-03 VITALS — BP 144/71 | HR 82 | Ht 71.0 in | Wt 230.4 lb

## 2018-09-03 DIAGNOSIS — N401 Enlarged prostate with lower urinary tract symptoms: Secondary | ICD-10-CM | POA: Diagnosis not present

## 2018-09-03 DIAGNOSIS — R3912 Poor urinary stream: Secondary | ICD-10-CM

## 2018-09-03 NOTE — Progress Notes (Signed)
09/03/2018 12:20 PM   SMAYAN HACKBART 11-Apr-1951 [id number]  Referring provider: [name], MD [facility] [place], [place] [address]  Chief Complaint  Patient presents with  . Benign Prostatic Hypertrophy    HPI: 67 year old male presents today to establish local urologic care.  He has been followed by Dr. Jacqlyn Larsen for several years for BPH with lower urinary tract symptoms.  He last saw him at [facility] and December 2018.  He has had no significant change in his lower urinary tract symptoms since his last visit.  He has nocturia x1-2 and an intermittent urinary stream.  He is on finasteride and tamsulosin and states his lower urinary tract symptoms are not bothersome.  He denies dysuria or gross hematuria.  He denies flank, abdominal, pelvic or scrotal pain.  PSA December 2018 was 0.62.   PMH: Past Medical History:  Diagnosis Date  . Arthritis   . Diabetes mellitus without complication ([place])   . DVT (deep venous thrombosis) ([place]) 2011  . Hypertension   . PONV (postoperative nausea and vomiting)     Surgical History: Past Surgical History:  Procedure Laterality Date  . COLONOSCOPY W/ POLYPECTOMY  2015  . JOINT REPLACEMENT Left 2014  . TOTAL KNEE ARTHROPLASTY Right 09/05/2015   Procedure: TOTAL KNEE ARTHROPLASTY;  Surgeon: Dereck Leep, MD;  Location: [facility] ORS;  Service: Orthopedics;  Laterality: Right;    Home Medications:  Allergies as of 09/03/2018      Reactions   Lisinopril Cough   Zocor [simvastatin] Other (See Comments)   Unsure of reaction   Cyclobenzaprine Other (See Comments)   Dry mouth Dry mouth      Medication List        Accurate as of 09/03/18 12:20 PM. Always use your most recent med list.          amLODipine 5 MG tablet Commonly known as:  NORVASC Take 5 mg by mouth every morning.   atorvastatin 10 MG tablet Commonly known as:  LIPITOR Take 10 mg by mouth at bedtime.   COMBIGAN 0.2-0.5 % ophthalmic  solution Generic drug:  brimonidine-timolol Place 1 drop into both eyes every 12 (twelve) hours.   enoxaparin 40 MG/0.4ML injection Commonly known as:  LOVENOX Inject 0.4 mLs (40 mg total) into the skin daily.   finasteride 5 MG tablet Commonly known as:  PROSCAR Take 5 mg by mouth every morning.   glyBURIDE-metformin 5-500 MG tablet Commonly known as:  GLUCOVANCE Take 2 tablets by mouth daily with breakfast.   insulin detemir 100 UNIT/ML injection Commonly known as:  LEVEMIR Inject 10 Units into the skin at bedtime.   latanoprost 0.005 % ophthalmic solution Commonly known as:  XALATAN 1 drop at bedtime.   losartan-hydrochlorothiazide 100-25 MG tablet Commonly known as:  HYZAAR Take 1 tablet by mouth every morning.   oxyCODONE 5 MG immediate release tablet Commonly known as:  Oxy IR/ROXICODONE Take 1-2 tablets (5-10 mg total) by mouth every 4 (four) hours as needed for severe pain or breakthrough pain.   sitaGLIPtin 100 MG tablet Commonly known as:  JANUVIA Take 100 mg by mouth every morning.   tamsulosin 0.4 MG Caps capsule Commonly known as:  FLOMAX Take 0.4 mg by mouth 2 (two) times daily.   traMADol 50 MG tablet Commonly known as:  ULTRAM Take 1-2 tablets (50-100 mg total) by mouth every 4 (four) hours as needed for moderate pain.       Allergies:  Allergies  Allergen Reactions  . Lisinopril Cough  . Zocor [Simvastatin] Other (See Comments)    Unsure of reaction  . Cyclobenzaprine Other (See Comments)    Dry mouth Dry mouth     Family History: Family History  Problem Relation Age of Onset  . Prostate cancer Neg Hx   . Bladder Cancer Neg Hx   . Kidney cancer Neg Hx     Social History:  reports that he quit smoking about 50 years ago. He has never used smokeless tobacco. He reports that he does not drink alcohol or use drugs.  ROS: UROLOGY Frequent Urination?: No Hard to postpone urination?: No Burning/pain with urination?: No Get up at  night to urinate?: Yes Leakage of urine?: No Urine stream starts and stops?: Yes Trouble starting stream?: No Do you have to strain to urinate?: No Blood in urine?: No Urinary tract infection?: No Sexually transmitted disease?: No Injury to kidneys or bladder?: No Painful intercourse?: No Weak stream?: Yes Erection problems?: Yes Penile pain?: No  Gastrointestinal Nausea?: No Vomiting?: No Indigestion/heartburn?: No Diarrhea?: No Constipation?: No  Constitutional Fever: No Night sweats?: No Weight loss?: No Fatigue?: No  Skin Skin rash/lesions?: No Itching?: No  Eyes Blurred vision?: No Double vision?: No  Ears/Nose/Throat Sore throat?: No Sinus problems?: No  Hematologic/Lymphatic Swollen glands?: No Easy bruising?: No  Cardiovascular Leg swelling?: No Chest pain?: No  Respiratory Cough?: No Shortness of breath?: No  Endocrine Excessive thirst?: No  Musculoskeletal Back pain?: Yes Joint pain?: No  Neurological Headaches?: No Dizziness?: No  Psychologic Depression?: No Anxiety?: No  Physical Exam: BP (!) 144/71   Pulse 82   Ht 5\' 11"  (1.803 m)   Wt 230 lb 6.4 oz (104.5 kg)   BMI 32.13 kg/m   Constitutional:  Alert and oriented, No acute distress. HEENT: [place] AT, moist mucus membranes.  Trachea midline, no masses. Cardiovascular: No clubbing, cyanosis, or edema. Respiratory: Normal respiratory effort, no increased work of breathing. GI: Abdomen is soft, nontender, nondistended, no abdominal masses GU: No CVA tenderness.  Prostate 40 g, smooth without nodules Lymph: No cervical or inguinal lymphadenopathy. Skin: No rashes, bruises or suspicious lesions. Neurologic: Grossly intact, no focal deficits, moving all 4 extremities. Psychiatric: Normal mood and affect.   Assessment & Plan:   67 year old male with BPH and lower urinary tract symptoms.  He is on combination therapy and his voiding symptoms are not bothersome.  He will continue  finasteride/tamsulosin which are prescribed by his PCP.  We discussed prostate cancer screening which he desires to continue until at least age 77.  He will return in December for his annual PSA.  Continue annual follow-up.   Abbie Sons, [place] [address], [place], [place] [address] [phone]

## 2018-09-04 ENCOUNTER — Encounter: Payer: Self-pay | Admitting: Urology

## 2018-09-04 DIAGNOSIS — N401 Enlarged prostate with lower urinary tract symptoms: Secondary | ICD-10-CM | POA: Insufficient documentation

## 2018-09-04 DIAGNOSIS — R3912 Poor urinary stream: Principal | ICD-10-CM

## 2018-10-05 ENCOUNTER — Other Ambulatory Visit: Payer: Self-pay | Admitting: Family Medicine

## 2018-10-05 MED ORDER — FINASTERIDE 5 MG PO TABS: 5.0000 mg | ORAL_TABLET | Freq: Every morning | ORAL | 3 refills | Status: DC

## 2018-10-22 ENCOUNTER — Other Ambulatory Visit: Payer: Self-pay

## 2018-10-22 MED ORDER — TAMSULOSIN HCL 0.4 MG PO CAPS: 0.4000 mg | ORAL_CAPSULE | Freq: Two times a day (BID) | ORAL | 3 refills | Status: DC

## 2018-10-29 ENCOUNTER — Other Ambulatory Visit: Payer: Self-pay

## 2018-10-29 DIAGNOSIS — N401 Enlarged prostate with lower urinary tract symptoms: Secondary | ICD-10-CM

## 2018-10-29 DIAGNOSIS — R3912 Poor urinary stream: Principal | ICD-10-CM

## 2018-10-29 MED ORDER — TAMSULOSIN HCL 0.4 MG PO CAPS: 0.4000 mg | ORAL_CAPSULE | Freq: Two times a day (BID) | ORAL | 3 refills | Status: DC

## 2018-11-30 ENCOUNTER — Other Ambulatory Visit: Payer: Self-pay

## 2018-11-30 DIAGNOSIS — R3912 Poor urinary stream: Principal | ICD-10-CM

## 2018-11-30 DIAGNOSIS — N401 Enlarged prostate with lower urinary tract symptoms: Secondary | ICD-10-CM

## 2018-12-01 ENCOUNTER — Other Ambulatory Visit: Payer: Medicare HMO

## 2018-12-01 DIAGNOSIS — N401 Enlarged prostate with lower urinary tract symptoms: Secondary | ICD-10-CM

## 2018-12-01 DIAGNOSIS — R3912 Poor urinary stream: Principal | ICD-10-CM

## 2018-12-02 LAB — PSA: Prostate Specific Ag, Serum: 0.9 ng/mL (ref 0.0–4.0)

## 2019-07-28 ENCOUNTER — Telehealth: Payer: Self-pay

## 2019-07-28 MED ORDER — FINASTERIDE 5 MG PO TABS: 5.0000 mg | ORAL_TABLET | Freq: Every morning | ORAL | 3 refills | Status: DC

## 2019-07-28 NOTE — Telephone Encounter (Signed)
Finasteride refill sent to [facility]

## 2019-12-26 ENCOUNTER — Other Ambulatory Visit: Payer: Self-pay | Admitting: Urology

## 2019-12-26 DIAGNOSIS — N401 Enlarged prostate with lower urinary tract symptoms: Secondary | ICD-10-CM

## 2020-01-03 ENCOUNTER — Other Ambulatory Visit: Payer: Self-pay | Admitting: Urology

## 2020-01-03 DIAGNOSIS — N401 Enlarged prostate with lower urinary tract symptoms: Secondary | ICD-10-CM

## 2020-01-05 ENCOUNTER — Other Ambulatory Visit: Payer: Self-pay | Admitting: Urology

## 2020-01-05 DIAGNOSIS — N401 Enlarged prostate with lower urinary tract symptoms: Secondary | ICD-10-CM

## 2020-01-12 ENCOUNTER — Other Ambulatory Visit: Payer: Self-pay | Admitting: Urology

## 2020-01-12 DIAGNOSIS — R3912 Poor urinary stream: Secondary | ICD-10-CM

## 2020-01-12 DIAGNOSIS — N401 Enlarged prostate with lower urinary tract symptoms: Secondary | ICD-10-CM

## 2020-01-30 ENCOUNTER — Other Ambulatory Visit: Payer: Self-pay

## 2020-01-30 ENCOUNTER — Ambulatory Visit: Payer: Medicare HMO | Admitting: Urology

## 2020-01-30 ENCOUNTER — Encounter: Payer: Self-pay | Admitting: Urology

## 2020-01-30 VITALS — BP 135/71 | HR 80 | Ht 71.0 in | Wt 240.0 lb

## 2020-01-30 DIAGNOSIS — R3912 Poor urinary stream: Secondary | ICD-10-CM

## 2020-01-30 DIAGNOSIS — N401 Enlarged prostate with lower urinary tract symptoms: Secondary | ICD-10-CM | POA: Diagnosis not present

## 2020-01-30 MED ORDER — TAMSULOSIN HCL 0.4 MG PO CAPS: 0.4000 mg | ORAL_CAPSULE | Freq: Two times a day (BID) | ORAL | 3 refills | Status: DC

## 2020-01-30 NOTE — Progress Notes (Signed)
01/30/2020 3:36 PM   [address] Aug 27, 1951 [id number]  Referring provider: [name], MD [facility] [place],  [place] [address]  Chief Complaint  Patient presents with  . Benign Prostatic Hypertrophy    Urologic history: 1.  BPH with lower urinary tract symptoms -Combination therapy tamsulosin/finasteride   HPI: 69 y.o. male presents for follow-up of BPH.  He was last seen in September 2019.  He remains on tamsulosin and finasteride.  He has variable nocturia from 1-4.  He notes occasional increased frequency and urgency.  His symptoms are not bothersome enough that he desires change in therapy.  Denies dysuria or gross hematuria.  No flank, abdominal or pelvic pain.  Last PSA December 2019 was 0.9   PMH: Past Medical History:  Diagnosis Date  . Arthritis   . Diabetes mellitus without complication ([place])   . DVT (deep venous thrombosis) ([place]) 2011  . Hypertension   . PONV (postoperative nausea and vomiting)     Surgical History: Past Surgical History:  Procedure Laterality Date  . COLONOSCOPY W/ POLYPECTOMY  2015  . JOINT REPLACEMENT Left 2014  . TOTAL KNEE ARTHROPLASTY Right 09/05/2015   Procedure: TOTAL KNEE ARTHROPLASTY;  Surgeon: Dereck Leep, MD;  Location: [facility] ORS;  Service: Orthopedics;  Laterality: Right;    Home Medications:  Allergies as of 01/30/2020      Reactions   Lisinopril Cough   Zocor [simvastatin] Other (See Comments)   Unsure of reaction   Cyclobenzaprine Other (See Comments)   Dry mouth Dry mouth      Medication List       Accurate as of January 30, 2020  3:36 PM. If you have any questions, ask your nurse or doctor.        amLODipine 5 MG tablet Commonly known as: NORVASC Take 5 mg by mouth every morning.   atorvastatin 10 MG tablet Commonly known as: LIPITOR Take 10 mg by mouth at bedtime.   Combigan 0.2-0.5 % ophthalmic solution Generic drug: brimonidine-timolol Place 1 drop  into both eyes every 12 (twelve) hours.   enoxaparin 40 MG/0.4ML injection Commonly known as: LOVENOX Inject 0.4 mLs (40 mg total) into the skin daily.   finasteride 5 MG tablet Commonly known as: PROSCAR Take 1 tablet (5 mg total) by mouth every morning.   glyBURIDE-metformin 5-500 MG tablet Commonly known as: GLUCOVANCE Take 2 tablets by mouth daily with breakfast.   insulin detemir 100 UNIT/ML injection Commonly known as: LEVEMIR Inject 10 Units into the skin at bedtime.   latanoprost 0.005 % ophthalmic solution Commonly known as: XALATAN 1 drop at bedtime.   losartan-hydrochlorothiazide 100-25 MG tablet Commonly known as: HYZAAR Take 1 tablet by mouth every morning.   oxyCODONE 5 MG immediate release tablet Commonly known as: Oxy IR/ROXICODONE Take 1-2 tablets (5-10 mg total) by mouth every 4 (four) hours as needed for severe pain or breakthrough pain.   sitaGLIPtin 100 MG tablet Commonly known as: JANUVIA Take 100 mg by mouth every morning.   tamsulosin 0.4 MG Caps capsule Commonly known as: FLOMAX Take 1 capsule (0.4 mg total) by mouth 2 (two) times daily.   traMADol 50 MG tablet Commonly known as: ULTRAM Take 1-2 tablets (50-100 mg total) by mouth every 4 (four) hours as needed for moderate pain.       Allergies:  Allergies  Allergen Reactions  . Lisinopril Cough  . Zocor [Simvastatin] Other (See Comments)    Unsure of reaction  .  Cyclobenzaprine Other (See Comments)    Dry mouth Dry mouth     Family History: Family History  Problem Relation Age of Onset  . Prostate cancer Neg Hx   . Bladder Cancer Neg Hx   . Kidney cancer Neg Hx     Social History:  reports that he quit smoking about 51 years ago. He has never used smokeless tobacco. He reports that he does not drink alcohol or use drugs.  ROS: UROLOGY Frequent Urination?: No Hard to postpone urination?: No Burning/pain with urination?: Yes Get up at night to urinate?: No Leakage of  urine?: Yes Urine stream starts and stops?: No Trouble starting stream?: No Do you have to strain to urinate?: No Blood in urine?: No Urinary tract infection?: No Sexually transmitted disease?: No Injury to kidneys or bladder?: No Painful intercourse?: No Weak stream?: No Erection problems?: No Penile pain?: No  Gastrointestinal Nausea?: No Vomiting?: No Indigestion/heartburn?: No Diarrhea?: No Constipation?: No  Constitutional Fever: No Night sweats?: No Weight loss?: No Fatigue?: No  Skin Skin rash/lesions?: No Itching?: No  Eyes Blurred vision?: No Double vision?: No  Ears/Nose/Throat Sore throat?: No Sinus problems?: No  Hematologic/Lymphatic Swollen glands?: No Easy bruising?: No  Cardiovascular Leg swelling?: No Chest pain?: No  Respiratory Cough?: No Shortness of breath?: No  Endocrine Excessive thirst?: No  Musculoskeletal Back pain?: No Joint pain?: No  Neurological Headaches?: No Dizziness?: No  Psychologic Depression?: No Anxiety?: No  Physical Exam: BP 135/71   Pulse 80   Ht 5\' 11"  (1.803 m)   Wt 240 lb (108.9 kg)   BMI 33.47 kg/m   Constitutional:  Alert and oriented, No acute distress. HEENT: [place] AT, moist mucus membranes.  Trachea midline, no masses. Cardiovascular: No clubbing, cyanosis, or edema. Respiratory: Normal respiratory effort, no increased work of breathing. GU: Prostate 40 g, smooth without nodules Skin: No rashes, bruises or suspicious lesions. Neurologic: Grossly intact, no focal deficits, moving all 4 extremities. Psychiatric: Normal mood and affect.   Assessment & Plan:    - Benign prostatic hyperplasia with weak urinary stream Stable lower urinary tract symptoms.  He did not need a refill on finasteride.  Tamsulosin was refilled.  We again discussed prostate cancer screening between ages of 40-69 and he elected to have his PSA drawn today.  Continue annual follow-up.   Abbie Sons,  [place] [address], [place],  [address] [phone]

## 2020-01-31 ENCOUNTER — Telehealth: Payer: Self-pay | Admitting: *Deleted

## 2020-01-31 LAB — PSA: Prostate Specific Ag, Serum: 0.6 ng/mL (ref 0.0–4.0)

## 2020-01-31 NOTE — Telephone Encounter (Signed)
Notified patient as instructed, patient pleased. Discussed follow-up appointments, patient agrees  

## 2020-01-31 NOTE — Telephone Encounter (Signed)
-----   Message from Abbie Sons, MD sent at 01/31/2020  7:34 AM EST ----- PSA stable at 0.6

## 2020-02-28 ENCOUNTER — Other Ambulatory Visit: Payer: Self-pay | Admitting: Urology

## 2020-02-28 DIAGNOSIS — N401 Enlarged prostate with lower urinary tract symptoms: Secondary | ICD-10-CM

## 2020-03-08 ENCOUNTER — Other Ambulatory Visit
Admission: RE | Admit: 2020-03-08 | Discharge: 2020-03-08 | Disposition: A | Discharge status: Home / Self Care | Payer: Medicare HMO | Source: Ambulatory Visit | Attending: Internal Medicine | Admitting: Internal Medicine

## 2020-03-08 DIAGNOSIS — Z20822 Contact with and (suspected) exposure to covid-19: Secondary | ICD-10-CM | POA: Insufficient documentation

## 2020-03-08 DIAGNOSIS — Z01812 Encounter for preprocedural laboratory examination: Secondary | ICD-10-CM | POA: Diagnosis present

## 2020-03-08 LAB — SARS CORONAVIRUS 2 (TAT 6-24 HRS): SARS Coronavirus 2: NEGATIVE

## 2020-03-09 ENCOUNTER — Encounter: Payer: Self-pay | Admitting: Internal Medicine

## 2020-03-12 ENCOUNTER — Ambulatory Visit: Payer: Medicare HMO | Admitting: Anesthesiology

## 2020-03-12 ENCOUNTER — Encounter
Admission: RE | Disposition: A | Discharge status: Home / Self Care | Payer: Self-pay | Source: Home / Self Care | Attending: Internal Medicine

## 2020-03-12 ENCOUNTER — Encounter: Payer: Self-pay | Admitting: Internal Medicine

## 2020-03-12 ENCOUNTER — Other Ambulatory Visit: Payer: Self-pay

## 2020-03-12 ENCOUNTER — Ambulatory Visit
Admission: RE | Admit: 2020-03-12 | Discharge: 2020-03-12 | Disposition: A | Discharge status: Home / Self Care | Payer: Medicare HMO | Attending: Internal Medicine | Admitting: Internal Medicine

## 2020-03-12 DIAGNOSIS — M1990 Unspecified osteoarthritis, unspecified site: Secondary | ICD-10-CM | POA: Insufficient documentation

## 2020-03-12 DIAGNOSIS — I10 Essential (primary) hypertension: Secondary | ICD-10-CM | POA: Diagnosis not present

## 2020-03-12 DIAGNOSIS — Z888 Allergy status to other drugs, medicaments and biological substances status: Secondary | ICD-10-CM | POA: Diagnosis not present

## 2020-03-12 DIAGNOSIS — Z86010 Personal history of colonic polyps: Secondary | ICD-10-CM | POA: Insufficient documentation

## 2020-03-12 DIAGNOSIS — Z79899 Other long term (current) drug therapy: Secondary | ICD-10-CM | POA: Insufficient documentation

## 2020-03-12 DIAGNOSIS — H42 Glaucoma in diseases classified elsewhere: Secondary | ICD-10-CM | POA: Diagnosis not present

## 2020-03-12 DIAGNOSIS — Z1211 Encounter for screening for malignant neoplasm of colon: Secondary | ICD-10-CM | POA: Diagnosis present

## 2020-03-12 DIAGNOSIS — D123 Benign neoplasm of transverse colon: Secondary | ICD-10-CM | POA: Diagnosis not present

## 2020-03-12 DIAGNOSIS — Z794 Long term (current) use of insulin: Secondary | ICD-10-CM | POA: Insufficient documentation

## 2020-03-12 DIAGNOSIS — E7800 Pure hypercholesterolemia, unspecified: Secondary | ICD-10-CM | POA: Insufficient documentation

## 2020-03-12 DIAGNOSIS — Z86718 Personal history of other venous thrombosis and embolism: Secondary | ICD-10-CM | POA: Diagnosis not present

## 2020-03-12 DIAGNOSIS — D120 Benign neoplasm of cecum: Secondary | ICD-10-CM | POA: Insufficient documentation

## 2020-03-12 DIAGNOSIS — E1039 Type 1 diabetes mellitus with other diabetic ophthalmic complication: Secondary | ICD-10-CM | POA: Insufficient documentation

## 2020-03-12 HISTORY — DX: Pure hypercholesterolemia, unspecified: E78.00

## 2020-03-12 HISTORY — PX: COLONOSCOPY WITH PROPOFOL: SHX5780

## 2020-03-12 HISTORY — DX: Unspecified glaucoma: H40.9

## 2020-03-12 LAB — GLUCOSE, CAPILLARY: Glucose-Capillary: 187 mg/dL — ABNORMAL HIGH (ref 70–99)

## 2020-03-12 SURGERY — COLONOSCOPY WITH PROPOFOL
Anesthesia: General

## 2020-03-12 MED ORDER — LIDOCAINE HCL (PF) 2 % IJ SOLN
INTRAMUSCULAR | Status: AC
  Filled 2020-03-12: qty 20

## 2020-03-12 MED ORDER — CLINDAMYCIN PHOSPHATE 600 MG/50ML IV SOLN: 600.0000 mg | Freq: Once | INTRAVENOUS | Status: AC

## 2020-03-12 MED ORDER — PROPOFOL 500 MG/50ML IV EMUL
INTRAVENOUS | Status: DC | PRN
  Administered 2020-03-12: 125 ug/kg/min via INTRAVENOUS

## 2020-03-12 MED ORDER — PHENYLEPHRINE HCL (PRESSORS) 10 MG/ML IV SOLN
INTRAVENOUS | Status: AC
  Filled 2020-03-12: qty 1

## 2020-03-12 MED ORDER — PROPOFOL 10 MG/ML IV BOLUS
INTRAVENOUS | Status: AC
  Filled 2020-03-12: qty 20

## 2020-03-12 MED ORDER — CLINDAMYCIN PHOSPHATE 600 MG/50ML IV SOLN
INTRAVENOUS | Status: AC
  Administered 2020-03-12: 600 mg via INTRAVENOUS
  Filled 2020-03-12: qty 50

## 2020-03-12 MED ORDER — PROPOFOL 500 MG/50ML IV EMUL
INTRAVENOUS | Status: AC
  Filled 2020-03-12: qty 200

## 2020-03-12 MED ORDER — PROPOFOL 10 MG/ML IV BOLUS
INTRAVENOUS | Status: DC | PRN
  Administered 2020-03-12: 80 mg via INTRAVENOUS
  Administered 2020-03-12: 20 mg via INTRAVENOUS

## 2020-03-12 MED ORDER — SODIUM CHLORIDE 0.9 % IV SOLN
INTRAVENOUS | Status: DC
  Administered 2020-03-12: 1000 mL via INTRAVENOUS

## 2020-03-12 MED ORDER — ONDANSETRON HCL 4 MG/2ML IJ SOLN
INTRAMUSCULAR | Status: AC
  Administered 2020-03-12: 4 mg via INTRAVENOUS
  Filled 2020-03-12: qty 2

## 2020-03-12 MED ORDER — LIDOCAINE HCL (CARDIAC) PF 100 MG/5ML IV SOSY
PREFILLED_SYRINGE | INTRAVENOUS | Status: DC | PRN
  Administered 2020-03-12: 100 mg via INTRAVENOUS

## 2020-03-12 NOTE — H&P (Signed)
Outpatient short stay form Pre-procedure 03/12/2020 8:19 AM Bruce [name] Bruce [name], M.D.  Primary Physician: Bruce [name], M.D.  Reason for visit:  Personal hx of tubular adenoma (Colonoscopy 11/2014)  History of present illness:                            Patient presents for colonoscopy for a personal hx of colon polyps. The patient denies abdominal pain, abnormal weight loss or rectal bleeding.      Current Facility-Administered Medications:  .  0.9 %  sodium chloride infusion, , Intravenous, Continuous, [place], [name], MD, Last Rate: 20 mL/hr at 03/12/20 0750, 1,000 mL at 03/12/20 0750 .  clindamycin (CLEOCIN) IVPB 600 mg, 600 mg, Intravenous, Once, [place], [name], MD, Last Rate: 100 mL/hr at 03/12/20 0751, 600 mg at 03/12/20 0751  Medications Prior to Admission  Medication Sig Dispense Refill Last Dose  . amLODipine (NORVASC) 5 MG tablet Take 5 mg by mouth 2 (two) times daily.    03/12/2020 at 530  . aspirin 81 MG chewable tablet Chew 81 mg by mouth daily.   Past Week at Unknown time  . atorvastatin (LIPITOR) 10 MG tablet Take 10 mg by mouth at bedtime.   03/11/2020 at Unknown time  . brimonidine-timolol (COMBIGAN) 0.2-0.5 % ophthalmic solution Place 1 drop into both eyes every 12 (twelve) hours.   03/11/2020 at Unknown time  . carvedilol (COREG) 3.125 MG tablet Take 3.125 mg by mouth 2 (two) times daily with a meal.   03/11/2020 at Unknown time  . finasteride (PROSCAR) 5 MG tablet Take 1 tablet (5 mg total) by mouth every morning. 90 tablet 3 03/11/2020 at Unknown time  . glyBURIDE-metformin (GLUCOVANCE) 5-500 MG per tablet Take 2 tablets by mouth daily with breakfast.   Past Week at Unknown time  . insulin detemir (LEVEMIR) 100 UNIT/ML injection Inject 10 Units into the skin at bedtime.   03/12/2020 at 0530  . latanoprost (XALATAN) 0.005 % ophthalmic solution 1 drop at bedtime.   03/11/2020 at Unknown time  . losartan-hydrochlorothiazide (HYZAAR) 100-25 MG per tablet Take 1 tablet by  mouth every morning.   03/11/2020 at Unknown time  . tamsulosin (FLOMAX) 0.4 MG CAPS capsule TAKE ONE CAPSULE BY MOUTH TWICE A DAY 180 capsule 2 03/11/2020 at Unknown time  . traMADol (ULTRAM) 50 MG tablet Take 1-2 tablets (50-100 mg total) by mouth every 4 (four) hours as needed for moderate pain. 60 tablet 1 03/11/2020 at Unknown time  . enoxaparin (LOVENOX) 40 MG/0.4ML injection Inject 0.4 mLs (40 mg total) into the skin daily. (Patient not taking: Reported on 03/12/2020) 14 Syringe 0 Completed Course at Unknown time  . oxyCODONE (OXY IR/ROXICODONE) 5 MG immediate release tablet Take 1-2 tablets (5-10 mg total) by mouth every 4 (four) hours as needed for severe pain or breakthrough pain. (Patient not taking: Reported on 03/12/2020) 80 tablet 0 Not Taking at Unknown time  . sitaGLIPtin (JANUVIA) 100 MG tablet Take 100 mg by mouth every morning.   Not Taking at Unknown time     Allergies  Allergen Reactions  . Lisinopril Cough  . Zocor [Simvastatin] Other (See Comments)    Unsure of reaction  . Cyclobenzaprine Other (See Comments)    Dry mouth Dry mouth      Past Medical History:  Diagnosis Date  . Arthritis   . Diabetes mellitus without complication ([place])   . DVT (deep venous thrombosis) ([place]) 2011  . Glaucoma   .  Hypercholesterolemia   . Hypertension   . PONV (postoperative nausea and vomiting)     Review of systems:  Otherwise negative.    Physical Exam  Gen: Alert, oriented. Appears stated age.  HEENT: [place]/AT. PERRLA. Lungs: CTA, no wheezes. CV: RR nl S1, S2. Abd: soft, benign, no masses. BS+ Ext: No edema. Pulses 2+    Planned procedures: Proceed with colonoscopy. The patient understands the nature of the planned procedure, indications, risks, alternatives and potential complications including but not limited to bleeding, infection, perforation, damage to internal organs and possible oversedation/side effects from anesthesia. The patient agrees and gives consent to  proceed.  Please refer to procedure notes for findings, recommendations and patient disposition/instructions.     [name] Bruce [name], M.D. Gastroenterology 03/12/2020  8:19 AM

## 2020-03-12 NOTE — Anesthesia Postprocedure Evaluation (Signed)
Anesthesia Post Note  Patient: Bruce [name]  Procedure(s) Performed: COLONOSCOPY WITH PROPOFOL (N/A )  Patient location during evaluation: Endoscopy Anesthesia Type: General Level of consciousness: awake and alert Pain management: pain level controlled Vital Signs Assessment: post-procedure vital signs reviewed and stable Respiratory status: spontaneous breathing, nonlabored ventilation, respiratory function stable and patient connected to nasal cannula oxygen Cardiovascular status: blood pressure returned to baseline and stable Postop Assessment: no apparent nausea or vomiting Anesthetic complications: no     Last Vitals:  Vitals:   03/12/20 0726 03/12/20 0851  BP: 126/77 (!) 92/54  Pulse: 71 (!) 55  Resp: 18 12  Temp: (!) 36.1 C 36.4 C  SpO2: 100% 93%    Last Pain:  Vitals:   03/12/20 0851  TempSrc: Temporal  PainSc: Asleep                 Arita Miss

## 2020-03-12 NOTE — Transfer of Care (Signed)
Immediate Anesthesia Transfer of Care Note  Patient: Bruce [name]  Procedure(s) Performed: COLONOSCOPY WITH PROPOFOL (N/A )  Patient Location: Endoscopy Unit  Anesthesia Type:General  Level of Consciousness: awake, alert  and oriented  Airway & Oxygen Therapy: Patient Spontanous Breathing and Patient connected to nasal cannula oxygen  Post-op Assessment: Report given to RN  Post vital signs: Reviewed and stable  Last Vitals:  Vitals Value Taken Time  BP    Temp    Pulse    Resp    SpO2      Last Pain:  Vitals:   03/12/20 0726  TempSrc: Temporal  PainSc: 0-No pain         Complications: No apparent anesthesia complications

## 2020-03-12 NOTE — Interval H&P Note (Signed)
History and Physical Interval Note:  03/12/2020 8:20 AM  Bruce [name]  has presented today for surgery, with the diagnosis of PERS HX OF COLON POLYPS.  The various methods of treatment have been discussed with the patient and family. After consideration of risks, benefits and other options for treatment, the patient has consented to  Procedure(s): COLONOSCOPY WITH PROPOFOL (N/A) as a surgical intervention.  The patient's history has been reviewed, patient examined, no change in status, stable for surgery.  I have reviewed the patient's chart and labs.  Questions were answered to the patient's satisfaction.     [place], [place]

## 2020-03-12 NOTE — Anesthesia Preprocedure Evaluation (Addendum)
Anesthesia Evaluation  Patient identified by MRN, date of birth, ID band Patient awake    Reviewed: Allergy & Precautions, NPO status , Patient's Chart, lab work & pertinent test results  History of Anesthesia Complications (+) PONV and history of anesthetic complications  Airway Mallampati: III  TM Distance: >3 FB Neck ROM: Full    Dental no notable dental hx. (+) Teeth Intact   Pulmonary neg pulmonary ROS, neg sleep apnea, neg COPD, Patient abstained from smoking.Not current smoker, former smoker,    Pulmonary exam normal breath sounds clear to auscultation + decreased breath sounds      Cardiovascular Exercise Tolerance: Good METShypertension, Pt. on medications (-) CAD and (-) Past MI Normal cardiovascular exam(-) dysrhythmias  Rhythm:Regular Rate:Normal - Systolic murmurs    Neuro/Psych negative neurological ROS  negative psych ROS   GI/Hepatic negative GI ROS, neg GERD  ,(+)     (-) substance abuse  ,   Endo/Other  diabetes, Type 1, Insulin Dependent  Renal/GU negative Renal ROS     Musculoskeletal   Abdominal (+) - obese,   Peds  Hematology negative hematology ROS (+)   Anesthesia Other Findings Past Medical History: No date: Arthritis No date: Diabetes mellitus without complication ([place]) [id number]: DVT (deep venous thrombosis) ([facility]) No date: Glaucoma No date: Hypercholesterolemia No date: Hypertension No date: PONV (postoperative nausea and vomiting)  Reproductive/Obstetrics                            Anesthesia Physical  Anesthesia Plan  ASA: III  Anesthesia Plan: General   Post-op Pain Management:    Induction: Intravenous  PONV Risk Score and Plan: 3 and Ondansetron, Propofol infusion and TIVA  Airway Management Planned: Nasal Cannula  Additional Equipment: None  Intra-op Plan:   Post-operative Plan:   Informed Consent: I have reviewed the patients History and  Physical, chart, labs and discussed the procedure including the risks, benefits and alternatives for the proposed anesthesia with the patient or authorized representative who has indicated his/her understanding and acceptance.     Dental advisory given  Plan Discussed with: CRNA and Surgeon  Anesthesia Plan Comments: (Discussed risks of anesthesia with patient, including possibility of difficulty with spontaneous ventilation under anesthesia necessitating airway intervention, PONV, and rare risks such as cardiac or respiratory or neurological events. Patient understands.)       Anesthesia Quick Evaluation

## 2020-03-12 NOTE — Op Note (Signed)
[facility] Gastroenterology Patient Name: Bruce [name] Procedure Date: 03/12/2020 7:52 AM MRN: [id number] Account #: [id number] Date of Birth: 1951/01/07 Admit Type: Outpatient Age: 69 Room: [facility] ENDO ROOM 3 Gender: Male Note Status: Finalized Procedure:             Colonoscopy Indications:           Surveillance: Personal history of adenomatous polyps                         on last colonoscopy > 5 years ago Providers:             Lorie Apley K. [name] MD, MD Referring MD:          [name] (Referring MD) Medicines:             Propofol per Anesthesia Complications:         No immediate complications. Procedure:             Pre-Anesthesia Assessment:                        - The risks and benefits of the procedure and the                         sedation options and risks were discussed with the                         patient. All questions were answered and informed                         consent was obtained.                        - Patient identification and proposed procedure were                         verified prior to the procedure by the nurse. The                         procedure was verified in the procedure room.                        - ASA Grade Assessment: III - A patient with severe                         systemic disease.                        - After reviewing the risks and benefits, the patient                         was deemed in satisfactory condition to undergo the                         procedure.                        After obtaining informed consent, the colonoscope was                         passed under direct vision. Throughout  the procedure,                         the patient's blood pressure, pulse, and oxygen                         saturations were monitored continuously. The                         Colonoscope was introduced through the anus and                         advanced to the the cecum, identified by  appendiceal                         orifice and ileocecal valve. The colonoscopy was                         performed without difficulty. The patient tolerated                         the procedure well. The quality of the bowel                         preparation was good. The ileocecal valve, appendiceal                         orifice, and rectum were photographed. Findings:      The perianal and digital rectal examinations were normal. Pertinent       negatives include normal sphincter tone and no palpable rectal lesions.      A 7 mm polyp was found in the transverse colon. The polyp was sessile.       The polyp was removed with a cold snare. Resection and retrieval were       complete.      A 10 mm polyp was found in the ileocecal valve. The polyp was       semi-pedunculated. The polyp was removed with a hot snare. Resection and       retrieval were complete.      The exam was otherwise without abnormality on direct and retroflexion       views. Impression:            - One 7 mm polyp in the transverse colon, removed with                         a cold snare. Resected and retrieved.                        - One 10 mm polyp at the ileocecal valve, removed with                         a hot snare. Resected and retrieved.                        - The examination was otherwise normal on direct and                         retroflexion views. Recommendation:        -  Patient has a contact number available for                         emergencies. The signs and symptoms of potential                         delayed complications were discussed with the patient.                         Return to normal activities tomorrow. Written                         discharge instructions were provided to the patient.                        - Resume previous diet.                        - Continue present medications.                        - Repeat colonoscopy is recommended for surveillance.                          The colonoscopy date will be determined after                         pathology results from today's exam become available                         for review.                        - Return to GI office PRN.                        - The findings and recommendations were discussed with                         the patient. Procedure Code(s):     --- Professional ---                        [phone], Colonoscopy, flexible; with removal of                         tumor(s), polyp(s), or other lesion(s) by snare                         technique Diagnosis Code(s):     --- Professional ---                        K63.5, Polyp of colon                        Z86.010, Personal history of colonic polyps CPT copyright 2019 American Medical Association. All rights reserved. The codes documented in this report are preliminary and upon coder review may  be revised to meet current compliance requirements. Efrain Sella MD, MD 03/12/2020 8:50:35 AM This report has been signed electronically. Number of Addenda: 0 Note Initiated On: 03/12/2020  7:52 AM Scope Withdrawal Time: 0 hours 7 minutes 41 seconds  Total Procedure Duration: 0 hours 11 minutes 19 seconds  Estimated Blood Loss:  Estimated blood loss: none.      [facility]

## 2020-03-13 ENCOUNTER — Encounter: Payer: Self-pay | Admitting: *Deleted

## 2020-03-14 LAB — SURGICAL PATHOLOGY

## 2020-08-10 ENCOUNTER — Other Ambulatory Visit: Payer: Self-pay | Admitting: Urology

## 2020-11-20 ENCOUNTER — Other Ambulatory Visit: Payer: Self-pay | Admitting: Family Medicine

## 2020-11-20 ENCOUNTER — Other Ambulatory Visit (HOSPITAL_COMMUNITY): Payer: Self-pay | Admitting: Family Medicine

## 2020-11-20 DIAGNOSIS — Z87891 Personal history of nicotine dependence: Secondary | ICD-10-CM

## 2020-11-28 ENCOUNTER — Other Ambulatory Visit: Payer: Self-pay

## 2020-11-28 ENCOUNTER — Ambulatory Visit
Admission: RE | Admit: 2020-11-28 | Discharge: 2020-11-28 | Disposition: A | Discharge status: Home / Self Care | Payer: Medicare HMO | Source: Ambulatory Visit | Attending: Family Medicine | Admitting: Family Medicine

## 2020-11-28 DIAGNOSIS — Z87891 Personal history of nicotine dependence: Secondary | ICD-10-CM | POA: Insufficient documentation

## 2020-11-28 DIAGNOSIS — Z136 Encounter for screening for cardiovascular disorders: Secondary | ICD-10-CM | POA: Diagnosis present

## 2020-12-31 ENCOUNTER — Other Ambulatory Visit: Payer: Self-pay | Admitting: Urology

## 2020-12-31 DIAGNOSIS — N401 Enlarged prostate with lower urinary tract symptoms: Secondary | ICD-10-CM

## 2020-12-31 DIAGNOSIS — R3912 Poor urinary stream: Secondary | ICD-10-CM

## 2021-01-23 ENCOUNTER — Other Ambulatory Visit: Payer: Self-pay

## 2021-01-23 DIAGNOSIS — R3912 Poor urinary stream: Secondary | ICD-10-CM

## 2021-01-23 DIAGNOSIS — N401 Enlarged prostate with lower urinary tract symptoms: Secondary | ICD-10-CM

## 2021-01-27 ENCOUNTER — Other Ambulatory Visit: Payer: Self-pay | Admitting: Urology

## 2021-01-27 DIAGNOSIS — N401 Enlarged prostate with lower urinary tract symptoms: Secondary | ICD-10-CM

## 2021-01-28 ENCOUNTER — Other Ambulatory Visit: Payer: Medicare HMO

## 2021-01-28 ENCOUNTER — Other Ambulatory Visit: Payer: Self-pay

## 2021-01-28 DIAGNOSIS — R3912 Poor urinary stream: Secondary | ICD-10-CM

## 2021-01-28 DIAGNOSIS — N401 Enlarged prostate with lower urinary tract symptoms: Secondary | ICD-10-CM

## 2021-01-29 LAB — PSA: Prostate Specific Ag, Serum: 0.7 ng/mL (ref 0.0–4.0)

## 2021-01-30 ENCOUNTER — Encounter: Payer: Self-pay | Admitting: Urology

## 2021-01-30 ENCOUNTER — Other Ambulatory Visit: Payer: Self-pay

## 2021-01-30 ENCOUNTER — Ambulatory Visit (INDEPENDENT_AMBULATORY_CARE_PROVIDER_SITE_OTHER): Payer: Medicare HMO | Admitting: Urology

## 2021-01-30 DIAGNOSIS — R3912 Poor urinary stream: Secondary | ICD-10-CM | POA: Diagnosis not present

## 2021-01-30 DIAGNOSIS — N401 Enlarged prostate with lower urinary tract symptoms: Secondary | ICD-10-CM | POA: Diagnosis not present

## 2021-01-30 MED ORDER — TAMSULOSIN HCL 0.4 MG PO CAPS: ORAL_CAPSULE | ORAL | 3 refills | Status: DC

## 2021-01-30 MED ORDER — FINASTERIDE 5 MG PO TABS: ORAL_TABLET | ORAL | 2 refills | Status: DC

## 2021-01-30 NOTE — Progress Notes (Signed)
01/30/2021 2:47 PM   Hal Neer 1951-07-14 [id number]  Referring provider: [name], MD [place] [facility] [place],  [place] [address]  Chief Complaint  Patient presents with  . Benign Prostatic Hypertrophy     Urologic history: 1.  BPH with lower urinary tract symptoms -Combination therapy tamsulosin/finasteride  HPI: 70 y.o. male presents for annual follow-up.   Remains on tamsulosin/finasteride  Has variable voiding symptoms; notes occasional frequency, hesitancy and decreased stream however at other times has no symptoms.  He has variable nocturia 2-4  Denies history of snoring or sleep apnea  Denies dysuria, gross hematuria  No flank, abdominal or pelvic pain  PSA drawn 01/28/2021 stable at 0.7   PMH: Past Medical History:  Diagnosis Date  . Arthritis   . Diabetes mellitus without complication ([place])   . DVT (deep venous thrombosis) ([place]) 2011  . Glaucoma   . Hypercholesterolemia   . Hypertension   . PONV (postoperative nausea and vomiting)     Surgical History: Past Surgical History:  Procedure Laterality Date  . COLONOSCOPY W/ POLYPECTOMY  2015  . COLONOSCOPY WITH PROPOFOL N/A 03/12/2020   Procedure: COLONOSCOPY WITH PROPOFOL;  Surgeon: Toledo, [name], MD;  Location: [facility] ENDOSCOPY;  Service: Gastroenterology;  Laterality: N/A;  . JOINT REPLACEMENT Left 2014  . TOTAL KNEE ARTHROPLASTY Right 09/05/2015   Procedure: TOTAL KNEE ARTHROPLASTY;  Surgeon: Dereck Leep, MD;  Location: [facility] ORS;  Service: Orthopedics;  Laterality: Right;    Home Medications:  Allergies as of 01/30/2021      Reactions   Lisinopril Cough   Zocor [simvastatin] Other (See Comments)   Unsure of reaction   Cyclobenzaprine Other (See Comments)   Dry mouth Dry mouth      Medication List       Accurate as of January 30, 2021  2:47 PM. If you have any questions, ask your nurse or doctor.        amLODipine 5 MG tablet Commonly  known as: NORVASC Take 5 mg by mouth 2 (two) times daily.   aspirin 81 MG chewable tablet Chew 81 mg by mouth daily.   atorvastatin 10 MG tablet Commonly known as: LIPITOR Take 10 mg by mouth at bedtime.   brimonidine-timolol 0.2-0.5 % ophthalmic solution Commonly known as: COMBIGAN Place 1 drop into both eyes every 12 (twelve) hours.   carvedilol 3.125 MG tablet Commonly known as: COREG Take 3.125 mg by mouth 2 (two) times daily with a meal.   enoxaparin 40 MG/0.4ML injection Commonly known as: LOVENOX Inject 0.4 mLs (40 mg total) into the skin daily.   finasteride 5 MG tablet Commonly known as: PROSCAR TAKE 1 TABLET BY MOUTH EVERY DAY IN THE MORNING   glyBURIDE-metformin 5-500 MG tablet Commonly known as: GLUCOVANCE Take 2 tablets by mouth daily with breakfast.   insulin detemir 100 UNIT/ML injection Commonly known as: LEVEMIR Inject 10 Units into the skin at bedtime.   latanoprost 0.005 % ophthalmic solution Commonly known as: XALATAN 1 drop at bedtime.   losartan-hydrochlorothiazide 100-25 MG tablet Commonly known as: HYZAAR Take 1 tablet by mouth every morning.   oxyCODONE 5 MG immediate release tablet Commonly known as: Oxy IR/ROXICODONE Take 1-2 tablets (5-10 mg total) by mouth every 4 (four) hours as needed for severe pain or breakthrough pain.   sitaGLIPtin 100 MG tablet Commonly known as: JANUVIA Take 100 mg by mouth every morning.   tamsulosin 0.4 MG Caps capsule Commonly known as: FLOMAX TAKE  1 CAPSULE (0.4 MG TOTAL) BY MOUTH 2 (TWO) TIMES DAILY.   traMADol 50 MG tablet Commonly known as: ULTRAM Take 1-2 tablets (50-100 mg total) by mouth every 4 (four) hours as needed for moderate pain.       Allergies:  Allergies  Allergen Reactions  . Lisinopril Cough  . Zocor [Simvastatin] Other (See Comments)    Unsure of reaction  . Cyclobenzaprine Other (See Comments)    Dry mouth Dry mouth     Family History: Family History  Problem  Relation Age of Onset  . Prostate cancer Neg Hx   . Bladder Cancer Neg Hx   . Kidney cancer Neg Hx     Social History:  reports that he quit smoking about 52 years ago. He has never used smokeless tobacco. He reports that he does not drink alcohol and does not use drugs.   Physical Exam: BP (!) 181/86   Pulse 79   Ht 5\' 11"  (1.803 m)   Wt 240 lb (108.9 kg)   BMI 33.47 kg/m   Constitutional:  Alert and oriented, No acute distress. HEENT: [place] AT, moist mucus membranes.  Trachea midline, no masses. Cardiovascular: No clubbing, cyanosis, or edema. Respiratory: Normal respiratory effort, no increased work of breathing. GU: Prostate 40 g, smooth without nodules Neurologic: Grossly intact, no focal deficits, moving all 4 extremities. Psychiatric: Normal mood and affect.   Assessment & Plan:    1.  BPH with LUTS  Currently satisfied with voiding pattern  He did have questions regarding UroLift which I discussed in detail  Not interested in outlet procedures at this time  Continue annual follow-up  Tamsulosin/finasteride refilled   Abbie Sons, MD  [facility] [address], [place], [place] [address] [phone]

## 2021-10-09 ENCOUNTER — Other Ambulatory Visit: Payer: Self-pay | Admitting: Urology

## 2021-10-15 IMAGING — US US ABDOMINAL AORTA SCREENING AAA
1 series · 14 of 14 positions shown · non-contrast
Comparison: None.

CLINICAL DATA: Male between 65-75 years of age with a smoking
history.

EXAM:
US ABDOMINAL AORTA MEDICARE SCREENING
TECHNIQUE: Ultrasound examination of the abdominal aorta was performed as a
screening evaluation for abdominal aortic aneurysm.

[Series 1: us abdominal aorta screening aaa · 0.31mm/px · 14 of 14 slices shown]
[im 1/14]
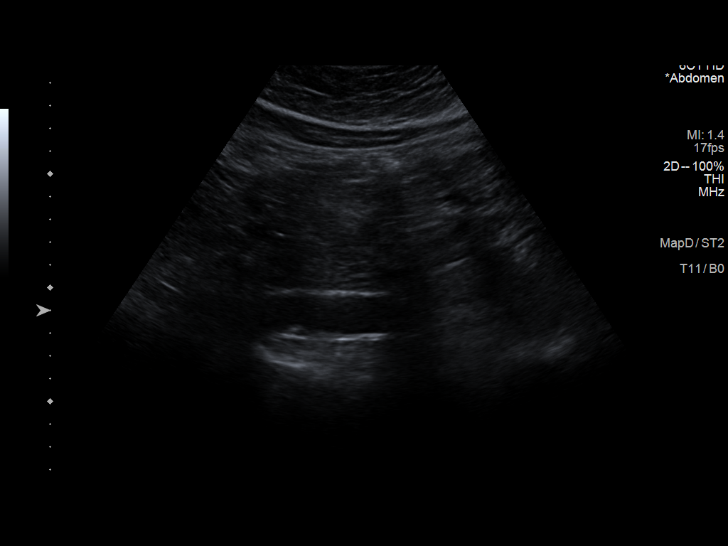
[im 2/14]
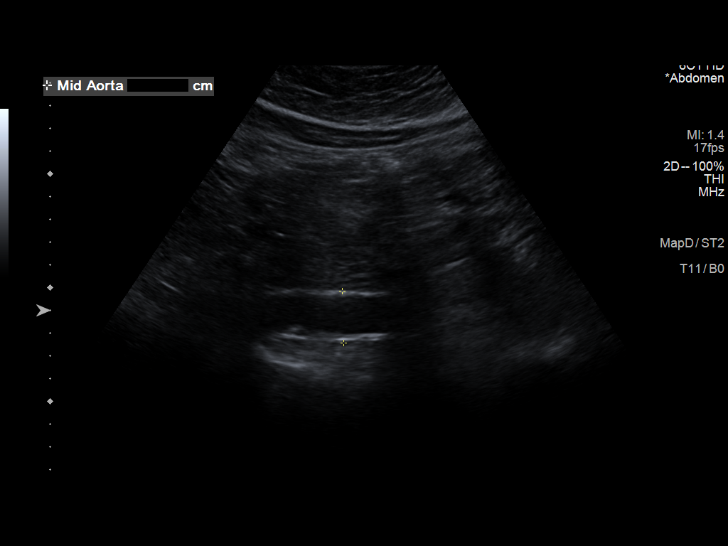
[im 3/14]
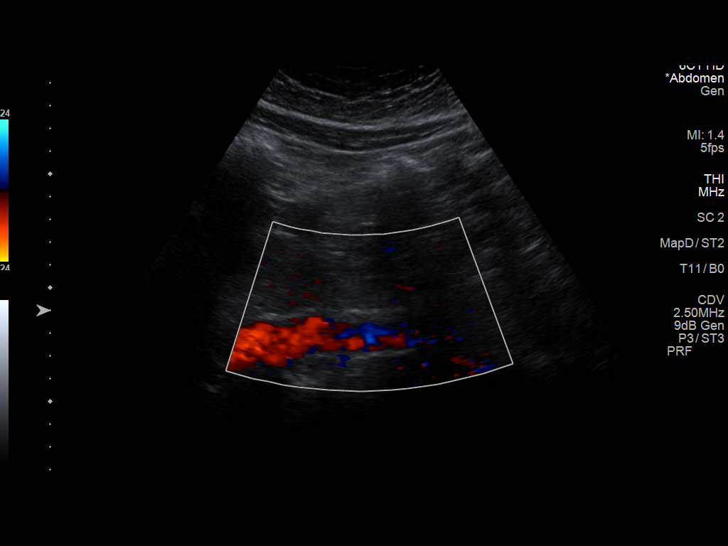
[im 4/14]
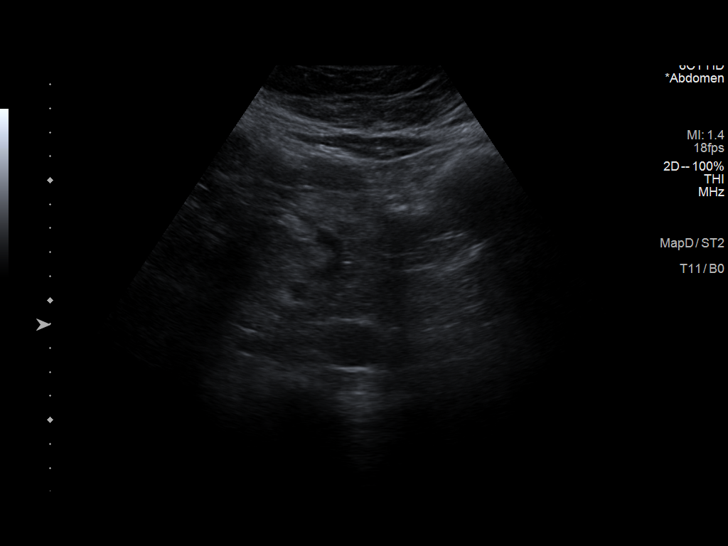
[im 5/14]
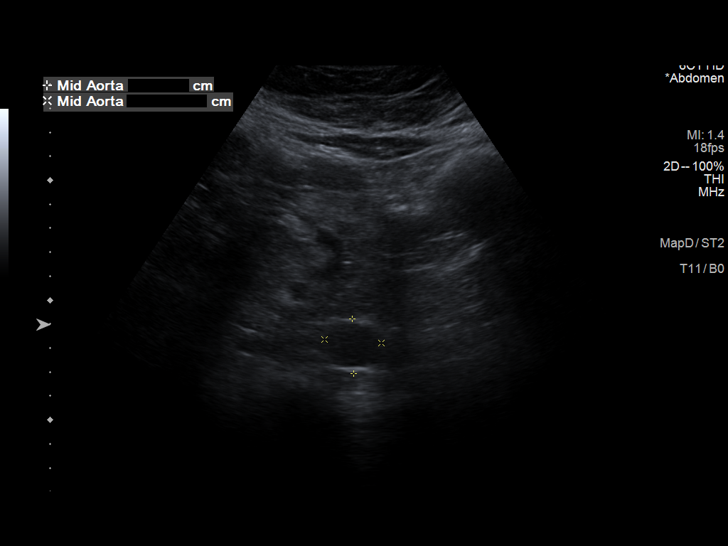
[im 6/14]
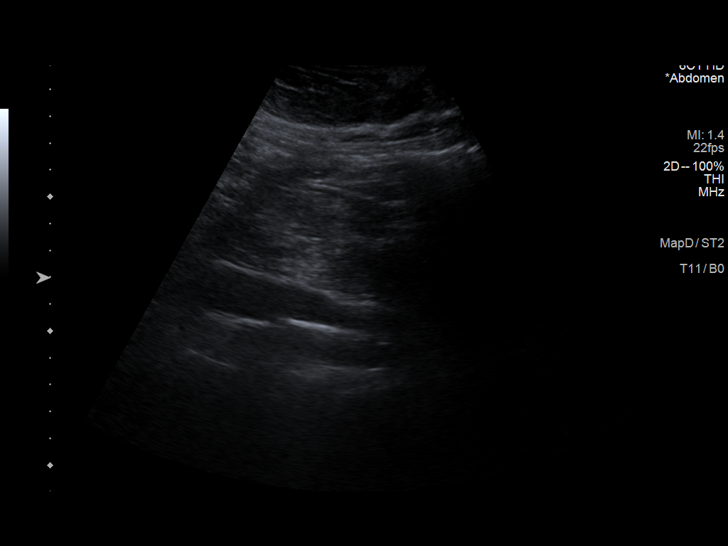
[im 7/14]
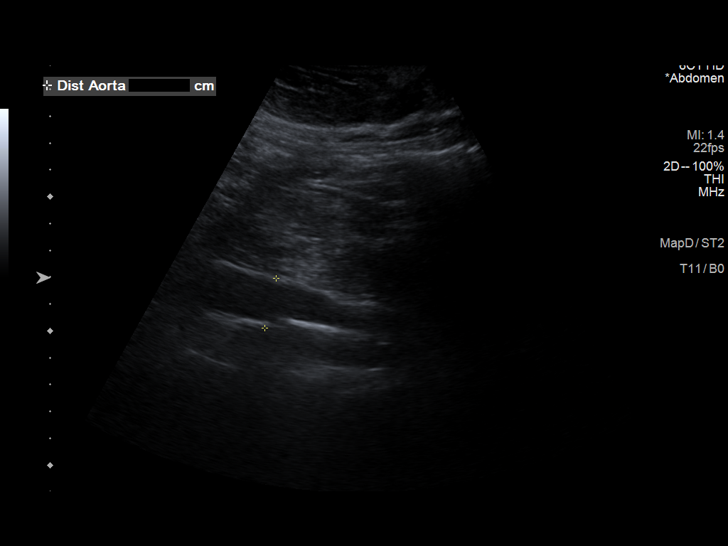
[im 8/14]
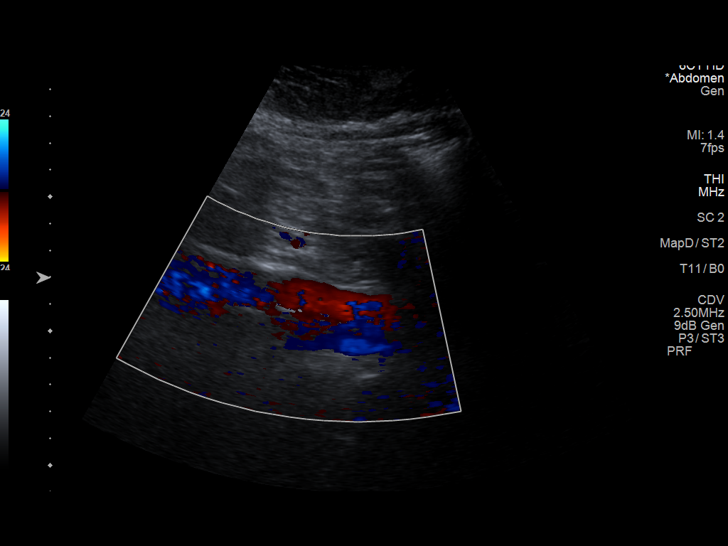
[im 9/14]
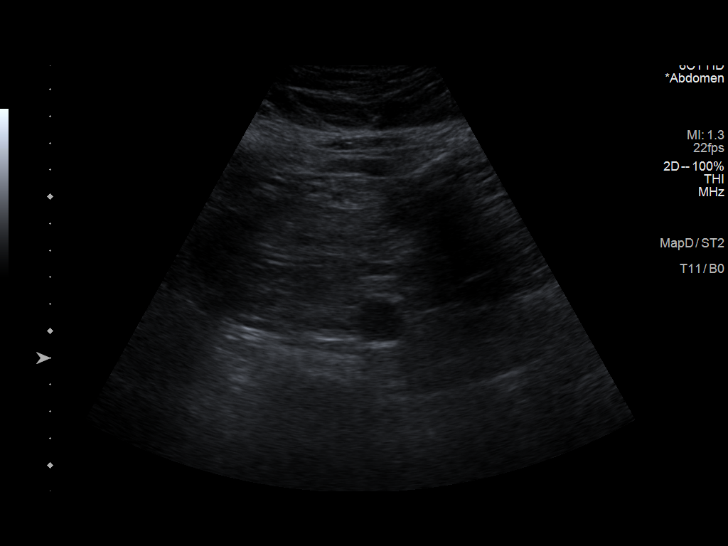
[im 10/14]
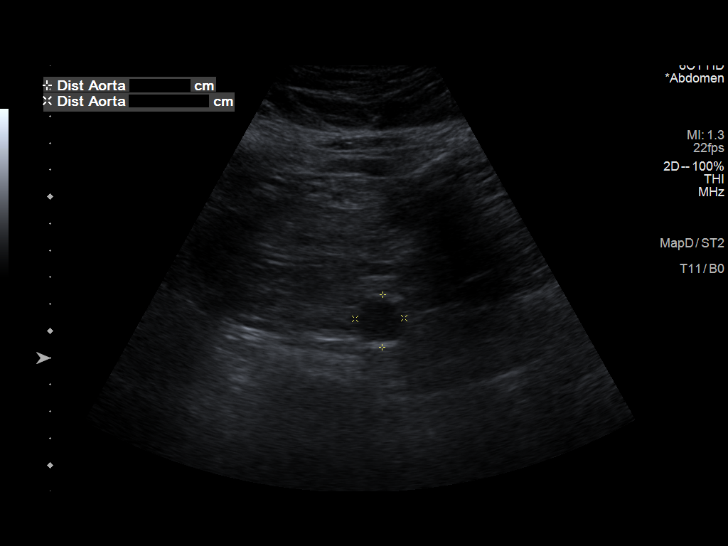
[im 11/14]
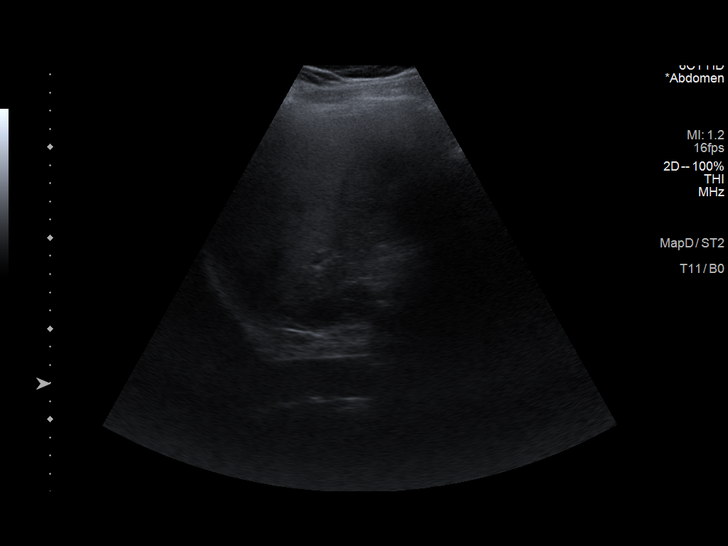
[im 12/14]
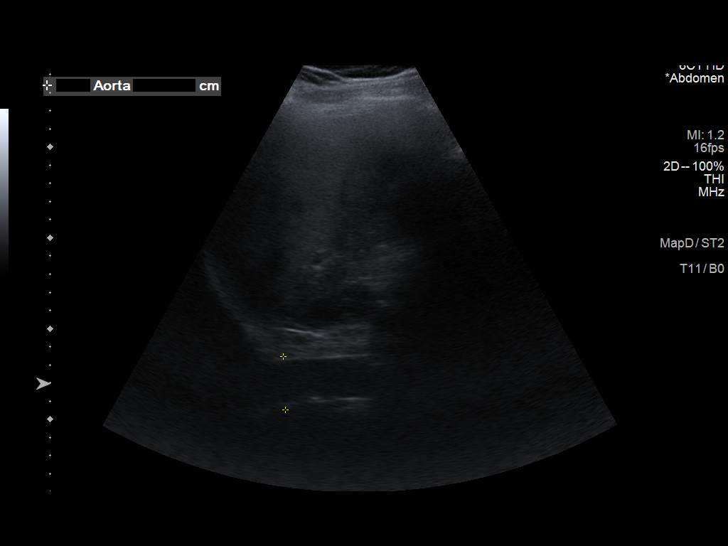
[im 13/14]
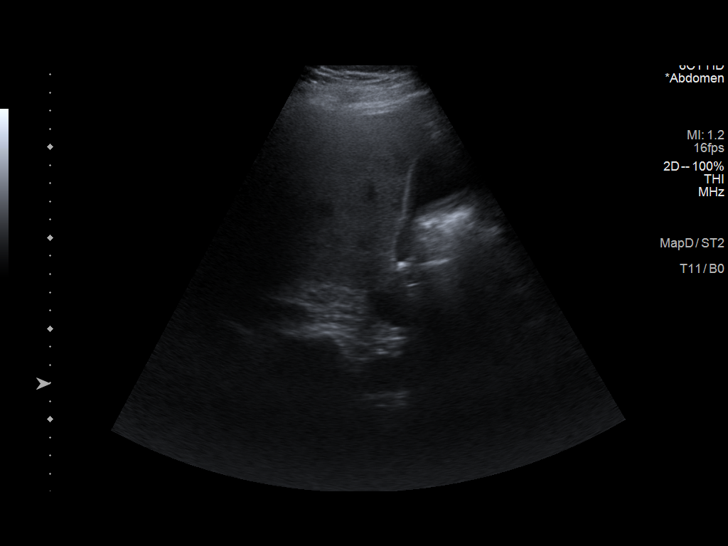
[im 14/14]
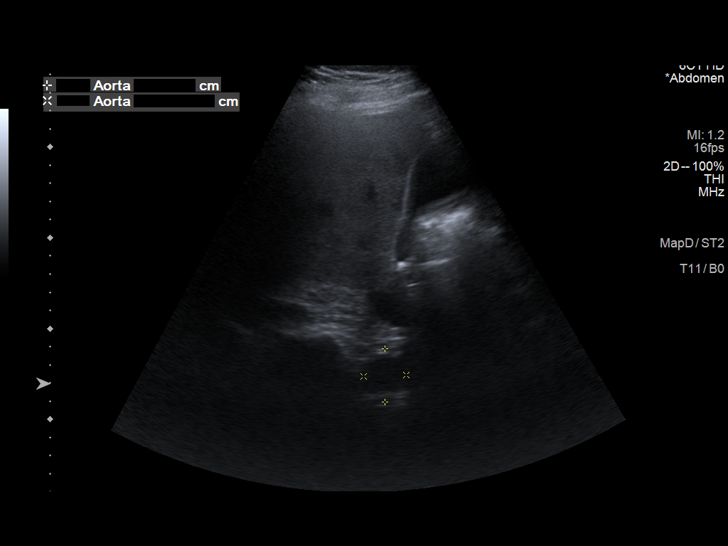

[14 of 14 positions shown; findings below may reference images not displayed]

FINDINGS: Abdominal aortic measurements as follows:

Proximal:  2.9 cm

Mid:  2.4 cm

Distal:  2.0 cm
IMPRESSION: No sonographic evidence of abdominal aortic aneurysm.

## 2021-12-02 ENCOUNTER — Encounter: Payer: Self-pay | Admitting: Urology

## 2022-01-07 ENCOUNTER — Other Ambulatory Visit: Payer: Self-pay | Admitting: Urology

## 2022-01-07 DIAGNOSIS — N401 Enlarged prostate with lower urinary tract symptoms: Secondary | ICD-10-CM

## 2022-02-03 ENCOUNTER — Encounter: Payer: Self-pay | Admitting: Urology

## 2022-02-03 ENCOUNTER — Ambulatory Visit: Payer: Self-pay | Admitting: Urology

## 2022-02-03 ENCOUNTER — Ambulatory Visit: Payer: Medicare HMO | Admitting: Urology

## 2022-02-03 ENCOUNTER — Other Ambulatory Visit: Payer: Self-pay

## 2022-02-03 VITALS — BP 129/69 | HR 69 | Ht 71.0 in | Wt 240.0 lb

## 2022-02-03 DIAGNOSIS — R351 Nocturia: Secondary | ICD-10-CM | POA: Diagnosis not present

## 2022-02-03 DIAGNOSIS — N401 Enlarged prostate with lower urinary tract symptoms: Secondary | ICD-10-CM

## 2022-02-03 DIAGNOSIS — R3912 Poor urinary stream: Secondary | ICD-10-CM

## 2022-02-03 MED ORDER — MIRABEGRON ER 50 MG PO TB24: 50.0000 mg | ORAL_TABLET | Freq: Every day | ORAL | 0 refills | Status: DC

## 2022-02-03 NOTE — Progress Notes (Signed)
02/03/2022 10:41 AM   Hal Neer [birth year] [id number]  Referring provider: [name], MD [place] [facility] [place],  [place] [address]  Chief Complaint  Patient presents with   Elevated PSA    Urologic history: 1.  BPH with lower urinary tract symptoms -Combination therapy tamsulosin/finasteride  HPI: 71 y.o. male presents for annual follow-up.  Complains of bothersome lower urinary tract symptoms though not sure he wants to have an outlet procedure IPSS 20/35 with most bothersome symptoms frequency and nocturia x4 No dysuria or gross hematuria Denies flank, abdominal or pelvic pain Remains on tamsulosin/finasteride       PMH: Past Medical History:  Diagnosis Date   Arthritis    Diabetes mellitus without complication ([place])    DVT (deep venous thrombosis) ([place]) 2011   Glaucoma    Hypercholesterolemia    Hypertension    PONV (postoperative nausea and vomiting)     Surgical History: Past Surgical History:  Procedure Laterality Date   COLONOSCOPY W/ POLYPECTOMY  2015   COLONOSCOPY WITH PROPOFOL N/A 03/12/2020   Procedure: COLONOSCOPY WITH PROPOFOL;  Surgeon: Toledo, [name], MD;  Location: [facility] ENDOSCOPY;  Service: Gastroenterology;  Laterality: N/A;   JOINT REPLACEMENT Left 2014   TOTAL KNEE ARTHROPLASTY Right 09/05/2015   Procedure: TOTAL KNEE ARTHROPLASTY;  Surgeon: Dereck Leep, MD;  Location: [facility] ORS;  Service: Orthopedics;  Laterality: Right;    Home Medications:  Allergies as of 02/03/2022       Reactions   Lisinopril Cough   Zocor [simvastatin] Other (See Comments)   Unsure of reaction   Cyclobenzaprine Other (See Comments)   Dry mouth Dry mouth        Medication List        Accurate as of February 03, 2022 10:41 AM. If you have any questions, ask your nurse or doctor.          amLODipine 5 MG tablet Commonly known as: NORVASC Take 5 mg by mouth 2 (two) times daily.   aspirin 81 MG  chewable tablet Chew 81 mg by mouth daily.   atorvastatin 10 MG tablet Commonly known as: LIPITOR Take 10 mg by mouth at bedtime.   brimonidine-timolol 0.2-0.5 % ophthalmic solution Commonly known as: COMBIGAN Place 1 drop into both eyes every 12 (twelve) hours.   carvedilol 3.125 MG tablet Commonly known as: COREG Take 3.125 mg by mouth 2 (two) times daily with a meal.   finasteride 5 MG tablet Commonly known as: PROSCAR TAKE 1 TABLET BY MOUTH EVERY DAY IN THE MORNING   insulin detemir 100 UNIT/ML injection Commonly known as: LEVEMIR Inject 10 Units into the skin at bedtime.   losartan-hydrochlorothiazide 100-25 MG tablet Commonly known as: HYZAAR Take 1 tablet by mouth every morning.   tamsulosin 0.4 MG Caps capsule Commonly known as: FLOMAX TAKE 1 CAPSULE BY MOUTH 2 TIMES DAILY.        Allergies:  Allergies  Allergen Reactions   Lisinopril Cough   Zocor [Simvastatin] Other (See Comments)    Unsure of reaction   Cyclobenzaprine Other (See Comments)    Dry mouth Dry mouth     Family History: Family History  Problem Relation Age of Onset   Prostate cancer Neg Hx    Bladder Cancer Neg Hx    Kidney cancer Neg Hx     Social History:  reports that he quit smoking about 53 years ago. His smoking use included cigarettes. He has never used smokeless tobacco.  He reports that he does not drink alcohol and does not use drugs.   Physical Exam: BP 129/69    Pulse 69    Ht 5\' 11"  (1.803 m)    Wt 240 lb (108.9 kg)    BMI 33.47 kg/m   Constitutional:  Alert and oriented, No acute distress. HEENT: [place] AT, moist mucus membranes.  Trachea midline, no masses. Cardiovascular: No clubbing, cyanosis, or edema. Respiratory: Normal respiratory effort, no increased work of breathing. Psychiatric: Normal mood and affect.   Assessment & Plan:    1. Benign prostatic hyperplasia with LUTS Bothersome voiding symptoms Most bothersome symptoms are frequency and nocturia and we  informed him our procedure may not improve the symptoms Trial Myrbetriq 50 mg daily-samples given Tamsulosin/finasteride refilled  He desires to continue prostate cancer screening and PSA drawn   Abbie Sons, MD  [facility] [address], [place], [place] [address] [phone]

## 2022-02-04 ENCOUNTER — Encounter: Payer: Self-pay | Admitting: *Deleted

## 2022-02-04 LAB — PSA: Prostate Specific Ag, Serum: 0.8 ng/mL (ref 0.0–4.0)

## 2022-02-05 ENCOUNTER — Ambulatory Visit: Payer: Medicare HMO | Admitting: Urology

## 2022-04-25 ENCOUNTER — Other Ambulatory Visit: Payer: Self-pay | Admitting: Urology

## 2022-07-12 ENCOUNTER — Other Ambulatory Visit: Payer: Self-pay | Admitting: Urology

## 2022-07-15 ENCOUNTER — Telehealth: Payer: Self-pay | Admitting: Urology

## 2022-07-15 NOTE — Telephone Encounter (Signed)
I went ahead and refilled his finasteride, but he needs to get scheduled for a yearly office visit with Dr. Bernardo Heater in February 2024.  I advise he go ahead and schedule this now, so that future refills will not be delayed.

## 2022-12-16 ENCOUNTER — Other Ambulatory Visit: Payer: Self-pay | Admitting: Urology

## 2022-12-16 DIAGNOSIS — N401 Enlarged prostate with lower urinary tract symptoms: Secondary | ICD-10-CM

## 2023-01-15 ENCOUNTER — Encounter: Payer: Self-pay | Admitting: Urology

## 2023-01-15 ENCOUNTER — Ambulatory Visit: Payer: Medicare HMO | Admitting: Urology

## 2023-01-15 VITALS — BP 148/81 | HR 73 | Ht 71.0 in | Wt 235.0 lb

## 2023-01-15 DIAGNOSIS — N401 Enlarged prostate with lower urinary tract symptoms: Secondary | ICD-10-CM | POA: Diagnosis not present

## 2023-01-15 DIAGNOSIS — R339 Retention of urine, unspecified: Secondary | ICD-10-CM | POA: Diagnosis not present

## 2023-01-15 DIAGNOSIS — R3912 Poor urinary stream: Secondary | ICD-10-CM | POA: Diagnosis not present

## 2023-01-15 LAB — BLADDER SCAN AMB NON-IMAGING: Scan Result: 257

## 2023-01-15 NOTE — Addendum Note (Signed)
Addended by: Despina Hidden on: 01/15/2023 04:20 PM   Modules accepted: Orders

## 2023-01-15 NOTE — Progress Notes (Signed)
01/15/2023 9:03 AM   Bruce [name] 03-24-1951 [id number]  Referring provider: [name], MD [place] [facility] [place],  [place] [address]  Chief Complaint  Patient presents with   Benign Prostatic Hypertrophy    Urologic history: 1.  BPH with lower urinary tract symptoms -Combination therapy tamsulosin/finasteride  HPI: 72 y.o. male presents for annual follow-up.  Doing well since last visit Stable LUTS on tamsulosin/finasteride He stopped Myrbetriq as he felt this caused increased urinary frequency IPSS today 13/35 (20/35 last year) Denies dysuria, gross hematuria Denies flank, abdominal or pelvic pain   PMH: Past Medical History:  Diagnosis Date   Arthritis    Diabetes mellitus without complication ([place])    DVT (deep venous thrombosis) ([place]) 2011   Glaucoma    Hypercholesterolemia    Hypertension    PONV (postoperative nausea and vomiting)     Surgical History: Past Surgical History:  Procedure Laterality Date   COLONOSCOPY W/ POLYPECTOMY  2015   COLONOSCOPY WITH PROPOFOL N/A 03/12/2020   Procedure: COLONOSCOPY WITH PROPOFOL;  Surgeon: Toledo, [name], MD;  Location: [facility] ENDOSCOPY;  Service: Gastroenterology;  Laterality: N/A;   JOINT REPLACEMENT Left 2014   TOTAL KNEE ARTHROPLASTY Right 09/05/2015   Procedure: TOTAL KNEE ARTHROPLASTY;  Surgeon: Dereck Leep, MD;  Location: [facility] ORS;  Service: Orthopedics;  Laterality: Right;    Home Medications:  Allergies as of 01/15/2023       Reactions   Lisinopril Cough   Zocor [simvastatin] Other (See Comments)   Unsure of reaction   Cyclobenzaprine Other (See Comments)   Dry mouth Dry mouth        Medication List        Accurate as of January 15, 2023  9:03 AM. If you have any questions, ask your nurse or doctor.          amLODipine 5 MG tablet Commonly known as: NORVASC Take 5 mg by mouth 2 (two) times daily.   aspirin 81 MG chewable tablet Chew 81 mg by  mouth daily.   atorvastatin 10 MG tablet Commonly known as: LIPITOR Take 10 mg by mouth at bedtime.   brimonidine-timolol 0.2-0.5 % ophthalmic solution Commonly known as: COMBIGAN Place 1 drop into both eyes every 12 (twelve) hours.   carvedilol 3.125 MG tablet Commonly known as: COREG Take 3.125 mg by mouth 2 (two) times daily with a meal.   finasteride 5 MG tablet Commonly known as: PROSCAR TAKE 1 TABLET BY MOUTH EVERY DAY IN THE MORNING   insulin detemir 100 UNIT/ML injection Commonly known as: LEVEMIR Inject 10 Units into the skin at bedtime.   losartan-hydrochlorothiazide 100-25 MG tablet Commonly known as: HYZAAR Take 1 tablet by mouth every morning.   mirabegron ER 50 MG Tb24 tablet Commonly known as: MYRBETRIQ Take 1 tablet (50 mg total) by mouth daily.   tamsulosin 0.4 MG Caps capsule Commonly known as: FLOMAX TAKE 1 CAPSULE BY MOUTH TWICE A DAY        Allergies:  Allergies  Allergen Reactions   Lisinopril Cough   Zocor [Simvastatin] Other (See Comments)    Unsure of reaction   Cyclobenzaprine Other (See Comments)    Dry mouth Dry mouth     Family History: Family History  Problem Relation Age of Onset   Prostate cancer Neg Hx    Bladder Cancer Neg Hx    Kidney cancer Neg Hx     Social History:  reports that he quit smoking about  54 years ago. His smoking use included cigarettes. He has never used smokeless tobacco. He reports that he does not drink alcohol and does not use drugs.   Physical Exam: BP (!) 148/81   Pulse 73   Ht '5\' 11"'$  (1.803 m)   Wt 235 lb (106.6 kg)   BMI 32.78 kg/m   Constitutional:  Alert and oriented, No acute distress. HEENT: [place] AT Respiratory: Normal respiratory effort, no increased work of breathing. Psychiatric: Normal mood and affect.   Assessment & Plan:    1. Benign prostatic hyperplasia with LUTS He could not give a urine specimen today and bladder scan was 257 mL I again discussed outlet procedures  since he is on maximum medical therapy He had been interested in UroLift but did not want to schedule office cystoscopy/TRUS.  We discussed these procedures are needed to determine if he is a candidate for UroLift or alternative outlet procedures He would like to think over and will call back if he desires to schedule Follow-up 6 months with repeat PVR  2.  Incomplete bladder emptying As above   Abbie Sons, [facility] [address] [place], [place] [address] [phone]

## 2023-07-07 ENCOUNTER — Ambulatory Visit: Payer: Medicare HMO

## 2023-07-07 DIAGNOSIS — Z09 Encounter for follow-up examination after completed treatment for conditions other than malignant neoplasm: Secondary | ICD-10-CM

## 2023-07-07 DIAGNOSIS — D122 Benign neoplasm of ascending colon: Secondary | ICD-10-CM

## 2023-07-07 DIAGNOSIS — Z86010 Personal history of colonic polyps: Secondary | ICD-10-CM

## 2023-07-07 DIAGNOSIS — K640 First degree hemorrhoids: Secondary | ICD-10-CM

## 2023-07-15 ENCOUNTER — Other Ambulatory Visit: Payer: Self-pay

## 2023-07-15 DIAGNOSIS — N401 Enlarged prostate with lower urinary tract symptoms: Secondary | ICD-10-CM

## 2023-07-16 ENCOUNTER — Other Ambulatory Visit: Payer: Medicare HMO

## 2023-07-16 DIAGNOSIS — N401 Enlarged prostate with lower urinary tract symptoms: Secondary | ICD-10-CM

## 2023-07-17 ENCOUNTER — Encounter: Payer: Self-pay | Admitting: Urology

## 2023-07-17 ENCOUNTER — Ambulatory Visit: Payer: Medicare HMO | Admitting: Urology

## 2023-07-17 VITALS — BP 158/88 | HR 70 | Ht 71.0 in | Wt 225.0 lb

## 2023-07-17 DIAGNOSIS — N401 Enlarged prostate with lower urinary tract symptoms: Secondary | ICD-10-CM

## 2023-07-17 DIAGNOSIS — R3912 Poor urinary stream: Secondary | ICD-10-CM | POA: Diagnosis not present

## 2023-07-17 LAB — BLADDER SCAN AMB NON-IMAGING

## 2023-07-17 NOTE — Progress Notes (Signed)
I, Bruce [name], acting as a scribe for Bruce Altes, MD., have documented all relevant documentation on the behalf of Bruce Altes, MD, as directed by Bruce Altes, MD while in the presence of Bruce Altes, MD.  07/17/2023 10:07 AM   Bruce [name] 12-17-1951 [id number]  Referring provider: Marisue Ivan, MD [phone] [facility] [address] [facility] [place],  [place] [address]  Chief Complaint  Patient presents with   Follow-up   Benign Prostatic Hypertrophy    follow-up   Urologic history: 1.  BPH with lower urinary tract symptoms Combination therapy tamsulosin/finasteride  HPI: Bruce [name] is a 72 y.o. male presents for a 6 month follow-up.   Stable LUTS on tamsulosin/finasteride Denies dysuria, gross hematuria Denies flank, abdominal or pelvic pain   PMH: Past Medical History:  Diagnosis Date   Arthritis    Diabetes mellitus without complication ([facility])    DVT (deep venous thrombosis) ([facility]) 2011   Glaucoma    Hypercholesterolemia    Hypertension    PONV (postoperative nausea and vomiting)     Surgical History: Past Surgical History:  Procedure Laterality Date   COLONOSCOPY W/ POLYPECTOMY  2015   COLONOSCOPY WITH PROPOFOL N/A 03/12/2020   Procedure: COLONOSCOPY WITH PROPOFOL;  Surgeon: Toledo, Boykin Nearing, MD;  Location: [facility] ENDOSCOPY;  Service: Gastroenterology;  Laterality: N/A;   JOINT REPLACEMENT Left 2014   TOTAL KNEE ARTHROPLASTY Right 09/05/2015   Procedure: TOTAL KNEE ARTHROPLASTY;  Surgeon: Donato Heinz, MD;  Location: [facility] ORS;  Service: Orthopedics;  Laterality: Right;    Home Medications:  Allergies as of 07/17/2023       Reactions   Lisinopril Cough   Zocor [simvastatin] Other (See Comments)   Unsure of reaction   Cyclobenzaprine Other (See Comments)   Dry mouth Dry mouth        Medication List        Accurate as of July 17, 2023 10:07 AM. If you have any questions, ask your nurse or doctor.           amLODipine 5 MG tablet Commonly known as: NORVASC Take 5 mg by mouth 2 (two) times daily.   aspirin 81 MG chewable tablet Chew 81 mg by mouth daily.   atorvastatin 10 MG tablet Commonly known as: LIPITOR Take 10 mg by mouth at bedtime.   brimonidine-timolol 0.2-0.5 % ophthalmic solution Commonly known as: COMBIGAN Place 1 drop into both eyes every 12 (twelve) hours.   carvedilol 3.125 MG tablet Commonly known as: COREG Take 3.125 mg by mouth 2 (two) times daily with a meal.   finasteride 5 MG tablet Commonly known as: PROSCAR TAKE 1 TABLET BY MOUTH EVERY DAY IN THE MORNING   insulin detemir 100 UNIT/ML injection Commonly known as: LEVEMIR Inject 10 Units into the skin at bedtime.   losartan-hydrochlorothiazide 100-25 MG tablet Commonly known as: HYZAAR Take 1 tablet by mouth every morning.   tamsulosin 0.4 MG Caps capsule Commonly known as: FLOMAX TAKE 1 CAPSULE BY MOUTH TWICE A DAY        Allergies:  Allergies  Allergen Reactions   Lisinopril Cough   Zocor [Simvastatin] Other (See Comments)    Unsure of reaction   Cyclobenzaprine Other (See Comments)    Dry mouth Dry mouth     Family History: Family History  Problem Relation Age of Onset   Prostate cancer Neg Hx    Bladder Cancer Neg Hx    Kidney cancer  Neg Hx     Social History:  reports that he quit smoking about 54 years ago. His smoking use included cigarettes. He has been exposed to tobacco smoke. He has never used smokeless tobacco. He reports that he does not drink alcohol and does not use drugs.   Physical Exam: BP (!) 158/88   Pulse 70   Ht 5\' 11"  (1.803 m)   Wt 225 lb (102.1 kg)   BMI 31.38 kg/m   Constitutional:  Alert and oriented, No acute distress. HEENT: [place] AT, moist mucus membranes.  Trachea midline, no masses. Cardiovascular: No clubbing, cyanosis, or edema. Respiratory: Normal respiratory effort, no increased work of breathing. GI: Abdomen is soft, nontender,  nondistended, no abdominal masses Skin: No rashes, bruises or suspicious lesions. Neurologic: Grossly intact, no focal deficits, moving all 4 extremities. Psychiatric: Normal mood and affect.   Assessment & Plan:    1. BPH with LUTS Stable PVR performed approximately 30 minutes after voiding was 190 mL. Continue tamsulosin/finasteride. 1 year follow-up with PVR.  [facility] [address] [place], [place] [address] [phone]

## 2023-07-19 ENCOUNTER — Encounter: Payer: Self-pay | Admitting: Urology

## 2024-01-10 ENCOUNTER — Other Ambulatory Visit: Payer: Self-pay | Admitting: Urology

## 2024-01-10 DIAGNOSIS — N401 Enlarged prostate with lower urinary tract symptoms: Secondary | ICD-10-CM

## 2024-07-15 ENCOUNTER — Ambulatory Visit (INDEPENDENT_AMBULATORY_CARE_PROVIDER_SITE_OTHER): Payer: Self-pay | Admitting: Urology

## 2024-07-15 ENCOUNTER — Encounter: Payer: Self-pay | Admitting: Urology

## 2024-07-15 VITALS — BP 156/85 | HR 101 | Ht 71.0 in | Wt 228.0 lb

## 2024-07-15 DIAGNOSIS — R399 Unspecified symptoms and signs involving the genitourinary system: Secondary | ICD-10-CM | POA: Diagnosis not present

## 2024-07-15 DIAGNOSIS — N401 Enlarged prostate with lower urinary tract symptoms: Secondary | ICD-10-CM | POA: Diagnosis not present

## 2024-07-15 LAB — BLADDER SCAN AMB NON-IMAGING

## 2024-07-15 NOTE — Progress Notes (Signed)
 07/15/2024 9:39 AM   Bruce [name] 02-Sep-1951 [id number]  Referring provider: Alla Amis, MD [phone] [facility] [address] [facility] [place],  [place] [address]  Chief Complaint  Patient presents with   Follow-up   Urologic history: 1.  BPH with lower urinary tract symptoms Combination therapy tamsulosin /finasteride   HPI: Bruce [name] is a 73 y.o. male presents for annual follow-up.   Stable LUTS on tamsulosin /finasteride  Denies dysuria, gross hematuria Denies flank, abdominal or pelvic pain   PMH: Past Medical History:  Diagnosis Date   Arthritis    Diabetes mellitus without complication (HCC)    DVT (deep venous thrombosis) (HCC) 2011   Glaucoma    Hypercholesterolemia    Hypertension    PONV (postoperative nausea and vomiting)     Surgical History: Past Surgical History:  Procedure Laterality Date   COLONOSCOPY W/ POLYPECTOMY  2015   COLONOSCOPY WITH PROPOFOL  N/A 03/12/2020   Procedure: COLONOSCOPY WITH PROPOFOL ;  Surgeon: Toledo, Ladell POUR, MD;  Location: [facility] ENDOSCOPY;  Service: Gastroenterology;  Laterality: N/A;   JOINT REPLACEMENT Left 2014   TOTAL KNEE ARTHROPLASTY Right 09/05/2015   Procedure: TOTAL KNEE ARTHROPLASTY;  Surgeon: Bruce SHAUNNA Hue, MD;  Location: [facility] ORS;  Service: Orthopedics;  Laterality: Right;    Home Medications:  Allergies as of 07/15/2024       Reactions   Lisinopril Cough   Zocor [simvastatin] Other (See Comments)   Unsure of reaction   Cyclobenzaprine Other (See Comments)   Dry mouth Dry mouth        Medication List        Accurate as of July 15, 2024  9:39 AM. If you have any questions, ask your nurse or doctor.          amLODipine  5 MG tablet Commonly known as: NORVASC  Take 5 mg by mouth 2 (two) times daily.   aspirin 81 MG chewable tablet Chew 81 mg by mouth daily.   atorvastatin  10 MG tablet Commonly known as: LIPITOR Take 10 mg by mouth at bedtime.   brimonidine -timolol  0.2-0.5 %  ophthalmic solution Commonly known as: COMBIGAN  Place 1 drop into both eyes every 12 (twelve) hours.   carvedilol 3.125 MG tablet Commonly known as: COREG Take 3.125 mg by mouth 2 (two) times daily with a meal.   finasteride  5 MG tablet Commonly known as: PROSCAR  TAKE 1 TABLET BY MOUTH EVERY DAY IN THE MORNING   insulin  detemir 100 UNIT/ML injection Commonly known as: LEVEMIR  Inject 10 Units into the skin at bedtime.   losartan -hydrochlorothiazide  100-25 MG tablet Commonly known as: HYZAAR Take 1 tablet by mouth every morning.   tamsulosin  0.4 MG Caps capsule Commonly known as: FLOMAX  TAKE 1 CAPSULE BY MOUTH TWICE A DAY        Allergies:  Allergies  Allergen Reactions   Lisinopril Cough   Zocor [Simvastatin] Other (See Comments)    Unsure of reaction   Cyclobenzaprine Other (See Comments)    Dry mouth Dry mouth     Family History: Family History  Problem Relation Age of Onset   Prostate cancer Neg Hx    Bladder Cancer Neg Hx    Kidney cancer Neg Hx     Social History:  reports that he quit smoking about 55 years ago. His smoking use included cigarettes. He has been exposed to tobacco smoke. He has never used smokeless tobacco. He reports that he does not drink alcohol and does not use drugs.   Physical Exam: BP Bruce [name])  156/85   Pulse (!) 101   Ht 5' 11 (1.803 m)   Wt 228 lb (103.4 kg)   BMI 31.80 kg/m   Constitutional:  Alert and oriented, No acute distress. HEENT: Bruce [name] AT Respiratory: Normal respiratory effort, no increased work of breathing. Psychiatric: Normal mood and affect.   Assessment & Plan:    1. BPH with LUTS Stable PVR 2 mL. Continue tamsulosin /finasteride . 1 year follow-up with PVR.   Bruce JAYSON Barba, MD  [facility] [address] [place], [place] [address] [phone]

## 2025-01-14 ENCOUNTER — Other Ambulatory Visit: Payer: Self-pay | Admitting: Urology

## 2025-01-14 DIAGNOSIS — N401 Enlarged prostate with lower urinary tract symptoms: Secondary | ICD-10-CM

## 2025-07-14 ENCOUNTER — Ambulatory Visit: Payer: Self-pay | Admitting: Urology
